# Patient Record
Sex: Female | Born: 1950 | Race: White | Hispanic: No | Marital: Married
Health system: Midwestern US, Community
[De-identification: ages and names within clinical notes are randomized; demographics above are authoritative.]

## PROBLEM LIST (undated history)

## (undated) DIAGNOSIS — K8020 Calculus of gallbladder without cholecystitis without obstruction: Secondary | ICD-10-CM

## (undated) DIAGNOSIS — F32A Depression, unspecified: Secondary | ICD-10-CM

## (undated) DIAGNOSIS — I499 Cardiac arrhythmia, unspecified: Secondary | ICD-10-CM

## (undated) DIAGNOSIS — J189 Pneumonia, unspecified organism: Secondary | ICD-10-CM

## (undated) DIAGNOSIS — F329 Major depressive disorder, single episode, unspecified: Secondary | ICD-10-CM

## (undated) DIAGNOSIS — E785 Hyperlipidemia, unspecified: Secondary | ICD-10-CM

## (undated) DIAGNOSIS — T7840XA Allergy, unspecified, initial encounter: Secondary | ICD-10-CM

## (undated) DIAGNOSIS — R519 Headache, unspecified: Secondary | ICD-10-CM

## (undated) DIAGNOSIS — M1990 Unspecified osteoarthritis, unspecified site: Secondary | ICD-10-CM

## (undated) DIAGNOSIS — D126 Benign neoplasm of colon, unspecified: Secondary | ICD-10-CM

## (undated) DIAGNOSIS — T8859XA Other complications of anesthesia, initial encounter: Secondary | ICD-10-CM

## (undated) HISTORY — PX: KNEE ARTHROSCOPY: SUR90

## (undated) HISTORY — DX: Major depressive disorder, single episode, unspecified: F32.9

## (undated) HISTORY — DX: Calculus of gallbladder without cholecystitis without obstruction: K80.20

## (undated) HISTORY — DX: Unspecified osteoarthritis, unspecified site: M19.90

## (undated) HISTORY — PX: APPENDECTOMY: SHX54

## (undated) HISTORY — DX: Depression, unspecified: F32.A

## (undated) HISTORY — DX: Allergy, unspecified, initial encounter: T78.40XA

## (undated) HISTORY — PX: COLONOSCOPY: SHX174

## (undated) HISTORY — DX: Hyperlipidemia, unspecified: E78.5

## (undated) HISTORY — PX: OTHER SURGICAL HISTORY: SHX169

## (undated) HISTORY — DX: Benign neoplasm of colon, unspecified: D12.6

## (undated) HISTORY — PX: BREAST ENHANCEMENT SURGERY: SHX7

## (undated) HISTORY — PX: ABDOMINAL HYSTERECTOMY: SHX81

## (undated) HISTORY — PX: THUMB ARTHROSCOPY: SHX2509

## (undated) HISTORY — PX: TONSILLECTOMY: SUR1361

---

## 1988-09-07 HISTORY — PX: CHOLECYSTECTOMY: SHX55

## 2002-06-15 ENCOUNTER — Other Ambulatory Visit: Admission: RE | Admit: 2002-06-15 | Discharge: 2002-06-15 | Payer: Self-pay | Admitting: Family Medicine

## 2003-10-10 ENCOUNTER — Other Ambulatory Visit: Admission: RE | Admit: 2003-10-10 | Discharge: 2003-10-10 | Payer: Self-pay | Admitting: Internal Medicine

## 2003-12-03 LAB — HM COLONOSCOPY: HM Colonoscopy: NORMAL

## 2004-04-13 ENCOUNTER — Emergency Department (HOSPITAL_COMMUNITY): Admission: EM | Admit: 2004-04-13 | Discharge: 2004-04-13 | Payer: Self-pay | Admitting: Emergency Medicine

## 2004-07-09 ENCOUNTER — Ambulatory Visit: Payer: Self-pay | Admitting: Family Medicine

## 2004-08-20 ENCOUNTER — Ambulatory Visit: Payer: Self-pay | Admitting: Internal Medicine

## 2004-12-02 ENCOUNTER — Ambulatory Visit: Payer: Self-pay | Admitting: Family Medicine

## 2004-12-02 ENCOUNTER — Encounter (INDEPENDENT_AMBULATORY_CARE_PROVIDER_SITE_OTHER): Payer: Self-pay | Admitting: Internal Medicine

## 2004-12-02 ENCOUNTER — Other Ambulatory Visit: Admission: RE | Admit: 2004-12-02 | Discharge: 2004-12-02 | Payer: Self-pay | Admitting: Family Medicine

## 2004-12-02 LAB — CONVERTED CEMR LAB: Pap Smear: NORMAL

## 2004-12-12 ENCOUNTER — Ambulatory Visit: Payer: Self-pay | Admitting: Family Medicine

## 2004-12-15 ENCOUNTER — Ambulatory Visit: Payer: Self-pay | Admitting: Family Medicine

## 2005-01-08 ENCOUNTER — Ambulatory Visit: Payer: Self-pay | Admitting: Family Medicine

## 2005-01-14 ENCOUNTER — Encounter (INDEPENDENT_AMBULATORY_CARE_PROVIDER_SITE_OTHER): Payer: Self-pay | Admitting: Specialist

## 2005-01-14 ENCOUNTER — Observation Stay (HOSPITAL_COMMUNITY): Admission: EM | Admit: 2005-01-14 | Discharge: 2005-01-15 | Payer: Self-pay | Admitting: Emergency Medicine

## 2005-04-02 ENCOUNTER — Ambulatory Visit: Payer: Self-pay | Admitting: Family Medicine

## 2005-05-04 ENCOUNTER — Ambulatory Visit: Payer: Self-pay | Admitting: Family Medicine

## 2005-05-15 ENCOUNTER — Ambulatory Visit: Payer: Self-pay | Admitting: Family Medicine

## 2005-06-08 ENCOUNTER — Ambulatory Visit: Payer: Self-pay | Admitting: Family Medicine

## 2005-07-01 ENCOUNTER — Ambulatory Visit: Payer: Self-pay | Admitting: Family Medicine

## 2005-07-24 ENCOUNTER — Ambulatory Visit: Payer: Self-pay | Admitting: Family Medicine

## 2005-09-14 ENCOUNTER — Ambulatory Visit: Payer: Self-pay | Admitting: Family Medicine

## 2005-10-15 ENCOUNTER — Ambulatory Visit: Payer: Self-pay | Admitting: Family Medicine

## 2006-01-06 ENCOUNTER — Ambulatory Visit: Payer: Self-pay | Admitting: Family Medicine

## 2006-02-11 ENCOUNTER — Ambulatory Visit: Payer: Self-pay | Admitting: Family Medicine

## 2006-03-26 ENCOUNTER — Ambulatory Visit: Payer: Self-pay | Admitting: Family Medicine

## 2006-03-26 LAB — FECAL OCCULT BLOOD, GUAIAC: Fecal Occult Blood: NEGATIVE

## 2006-06-07 ENCOUNTER — Ambulatory Visit: Payer: Self-pay | Admitting: Family Medicine

## 2006-08-03 ENCOUNTER — Ambulatory Visit: Payer: Self-pay | Admitting: Family Medicine

## 2006-09-09 ENCOUNTER — Ambulatory Visit: Payer: Self-pay | Admitting: Family Medicine

## 2006-11-01 ENCOUNTER — Ambulatory Visit: Payer: Self-pay | Admitting: Specialist

## 2006-11-25 ENCOUNTER — Ambulatory Visit: Payer: Self-pay | Admitting: Family Medicine

## 2006-12-03 ENCOUNTER — Ambulatory Visit: Payer: Self-pay | Admitting: Family Medicine

## 2007-04-27 ENCOUNTER — Ambulatory Visit: Payer: Self-pay | Admitting: Family Medicine

## 2007-04-27 DIAGNOSIS — M545 Low back pain, unspecified: Secondary | ICD-10-CM | POA: Insufficient documentation

## 2007-06-14 ENCOUNTER — Ambulatory Visit: Payer: Self-pay | Admitting: Family Medicine

## 2007-06-15 LAB — CONVERTED CEMR LAB
CO2: 31 meq/L (ref 19–32)
Cholesterol: 230 mg/dL (ref 0–200)
Creatinine, Ser: 0.8 mg/dL (ref 0.4–1.2)
Direct LDL: 149.4 mg/dL
Potassium: 4.8 meq/L (ref 3.5–5.1)
Sodium: 141 meq/L (ref 135–145)
VLDL: 30 mg/dL (ref 0–40)

## 2007-06-27 ENCOUNTER — Encounter (INDEPENDENT_AMBULATORY_CARE_PROVIDER_SITE_OTHER): Payer: Self-pay | Admitting: Internal Medicine

## 2007-06-28 ENCOUNTER — Ambulatory Visit: Payer: Self-pay | Admitting: Family Medicine

## 2007-06-29 ENCOUNTER — Encounter (INDEPENDENT_AMBULATORY_CARE_PROVIDER_SITE_OTHER): Payer: Self-pay | Admitting: *Deleted

## 2007-07-08 ENCOUNTER — Encounter (INDEPENDENT_AMBULATORY_CARE_PROVIDER_SITE_OTHER): Payer: Self-pay | Admitting: Internal Medicine

## 2007-07-13 ENCOUNTER — Encounter (INDEPENDENT_AMBULATORY_CARE_PROVIDER_SITE_OTHER): Payer: Self-pay | Admitting: Internal Medicine

## 2007-07-26 ENCOUNTER — Telehealth (INDEPENDENT_AMBULATORY_CARE_PROVIDER_SITE_OTHER): Payer: Self-pay | Admitting: Internal Medicine

## 2007-10-18 ENCOUNTER — Telehealth (INDEPENDENT_AMBULATORY_CARE_PROVIDER_SITE_OTHER): Payer: Self-pay | Admitting: Internal Medicine

## 2007-11-01 ENCOUNTER — Telehealth (INDEPENDENT_AMBULATORY_CARE_PROVIDER_SITE_OTHER): Payer: Self-pay | Admitting: Internal Medicine

## 2008-02-02 ENCOUNTER — Ambulatory Visit: Payer: Self-pay | Admitting: Family Medicine

## 2008-02-07 ENCOUNTER — Telehealth (INDEPENDENT_AMBULATORY_CARE_PROVIDER_SITE_OTHER): Payer: Self-pay | Admitting: Internal Medicine

## 2008-02-16 ENCOUNTER — Telehealth (INDEPENDENT_AMBULATORY_CARE_PROVIDER_SITE_OTHER): Payer: Self-pay | Admitting: Internal Medicine

## 2008-02-23 ENCOUNTER — Encounter (INDEPENDENT_AMBULATORY_CARE_PROVIDER_SITE_OTHER): Payer: Self-pay | Admitting: Internal Medicine

## 2008-06-04 ENCOUNTER — Ambulatory Visit: Payer: Self-pay | Admitting: Family Medicine

## 2008-06-04 LAB — CONVERTED CEMR LAB
Bilirubin Urine: NEGATIVE
Nitrite: NEGATIVE
Specific Gravity, Urine: 1.01
Urobilinogen, UA: 0.2
pH: 7.5

## 2008-06-05 ENCOUNTER — Encounter: Payer: Self-pay | Admitting: Family Medicine

## 2008-06-20 ENCOUNTER — Ambulatory Visit: Payer: Self-pay | Admitting: Family Medicine

## 2008-06-27 ENCOUNTER — Ambulatory Visit: Payer: Self-pay | Admitting: Family Medicine

## 2008-06-28 LAB — CONVERTED CEMR LAB
Bilirubin, Direct: 0.1 mg/dL (ref 0.0–0.3)
Calcium: 8.9 mg/dL (ref 8.4–10.5)
Cholesterol: 236 mg/dL (ref 0–200)
Creatinine, Ser: 0.9 mg/dL (ref 0.4–1.2)
GFR calc Af Amer: 83 mL/min
GFR calc non Af Amer: 69 mL/min
HDL: 54.1 mg/dL (ref >39.0)
Total Bilirubin: 0.9 mg/dL (ref 0.3–1.2)
Triglycerides: 107 mg/dL (ref 0–149)

## 2008-07-09 ENCOUNTER — Telehealth (INDEPENDENT_AMBULATORY_CARE_PROVIDER_SITE_OTHER): Payer: Self-pay | Admitting: Internal Medicine

## 2008-07-16 ENCOUNTER — Telehealth: Payer: Self-pay | Admitting: Family Medicine

## 2008-08-08 ENCOUNTER — Ambulatory Visit: Payer: Self-pay | Admitting: Family Medicine

## 2008-08-21 ENCOUNTER — Ambulatory Visit: Payer: Self-pay | Admitting: Family Medicine

## 2008-08-22 ENCOUNTER — Ambulatory Visit: Payer: Self-pay | Admitting: Family Medicine

## 2008-08-22 LAB — CONVERTED CEMR LAB
BUN: 18 mg/dL (ref 6–23)
Calcium: 9.2 mg/dL (ref 8.4–10.5)
Chloride: 106 meq/L (ref 96–112)
Eosinophils Absolute: 0.1 10*3/uL (ref 0.0–0.7)
GFR calc non Af Amer: 92 mL/min
Hemoglobin: 15 g/dL (ref 12.0–15.0)
Lipase: 26 units/L (ref 11.0–59.0)
MCV: 91.8 fL (ref 78.0–100.0)
Monocytes Absolute: 0.5 10*3/uL (ref 0.1–1.0)
OCCULT 1: NEGATIVE
Platelets: 182 10*3/uL (ref 150–400)
Potassium: 4.5 meq/L (ref 3.5–5.1)
RBC: 4.59 M/uL (ref 3.87–5.11)
RDW: 11.4 % — ABNORMAL LOW (ref 11.5–14.6)
Sodium: 140 meq/L (ref 135–145)

## 2008-08-23 ENCOUNTER — Encounter (INDEPENDENT_AMBULATORY_CARE_PROVIDER_SITE_OTHER): Payer: Self-pay | Admitting: *Deleted

## 2008-11-14 ENCOUNTER — Encounter (INDEPENDENT_AMBULATORY_CARE_PROVIDER_SITE_OTHER): Payer: Self-pay | Admitting: Internal Medicine

## 2008-11-20 ENCOUNTER — Encounter (INDEPENDENT_AMBULATORY_CARE_PROVIDER_SITE_OTHER): Payer: Self-pay | Admitting: Internal Medicine

## 2008-11-20 ENCOUNTER — Encounter (INDEPENDENT_AMBULATORY_CARE_PROVIDER_SITE_OTHER): Payer: Self-pay | Admitting: *Deleted

## 2008-12-04 ENCOUNTER — Telehealth (INDEPENDENT_AMBULATORY_CARE_PROVIDER_SITE_OTHER): Payer: Self-pay | Admitting: Internal Medicine

## 2008-12-19 ENCOUNTER — Encounter (INDEPENDENT_AMBULATORY_CARE_PROVIDER_SITE_OTHER): Payer: Self-pay | Admitting: Internal Medicine

## 2008-12-26 ENCOUNTER — Ambulatory Visit: Payer: Self-pay | Admitting: Psychiatry

## 2009-05-20 ENCOUNTER — Ambulatory Visit: Payer: Self-pay | Admitting: Internal Medicine

## 2009-07-09 ENCOUNTER — Ambulatory Visit: Payer: Self-pay | Admitting: Family Medicine

## 2009-07-09 ENCOUNTER — Other Ambulatory Visit: Admission: RE | Admit: 2009-07-09 | Discharge: 2009-07-09 | Payer: Self-pay | Admitting: Family Medicine

## 2009-07-09 ENCOUNTER — Encounter (INDEPENDENT_AMBULATORY_CARE_PROVIDER_SITE_OTHER): Payer: Self-pay | Admitting: Internal Medicine

## 2009-07-15 ENCOUNTER — Encounter (INDEPENDENT_AMBULATORY_CARE_PROVIDER_SITE_OTHER): Payer: Self-pay | Admitting: Internal Medicine

## 2009-08-12 ENCOUNTER — Encounter (INDEPENDENT_AMBULATORY_CARE_PROVIDER_SITE_OTHER): Payer: Self-pay | Admitting: Internal Medicine

## 2009-08-12 DIAGNOSIS — Z87891 Personal history of nicotine dependence: Secondary | ICD-10-CM | POA: Insufficient documentation

## 2009-08-12 DIAGNOSIS — I498 Other specified cardiac arrhythmias: Secondary | ICD-10-CM | POA: Insufficient documentation

## 2009-08-26 ENCOUNTER — Ambulatory Visit: Payer: Self-pay | Admitting: Family Medicine

## 2009-08-28 LAB — CONVERTED CEMR LAB
BUN: 13 mg/dL (ref 6–23)
Basophils Absolute: 0.1 10*3/uL (ref 0.0–0.1)
Basophils Relative: 1 % (ref 0.0–3.0)
Bilirubin, Direct: 0 mg/dL (ref 0.0–0.3)
Chloride: 106 meq/L (ref 96–112)
Direct LDL: 156 mg/dL
Eosinophils Absolute: 0.2 10*3/uL (ref 0.0–0.7)
GFR calc non Af Amer: 91.47 mL/min (ref >60)
HDL: 55.3 mg/dL (ref >39.00)
Lymphs Abs: 2 10*3/uL (ref 0.7–4.0)
MCV: 93.8 fL (ref 78.0–100.0)
Monocytes Absolute: 0.5 10*3/uL (ref 0.1–1.0)
Neutro Abs: 2.8 10*3/uL (ref 1.4–7.7)
Potassium: 4 meq/L (ref 3.5–5.1)
RBC: 4.87 M/uL (ref 3.87–5.11)
RDW: 12.1 % (ref 11.5–14.6)
Total Bilirubin: 0.9 mg/dL (ref 0.3–1.2)
Total CHOL/HDL Ratio: 4
Total Protein: 6.8 g/dL (ref 6.0–8.3)
Triglycerides: 106 mg/dL (ref 0.0–149.0)
VLDL: 21.2 mg/dL (ref 0.0–40.0)
WBC: 5.6 10*3/uL (ref 4.5–10.5)

## 2009-11-06 ENCOUNTER — Ambulatory Visit: Payer: Self-pay | Admitting: Internal Medicine

## 2009-11-06 DIAGNOSIS — M1990 Unspecified osteoarthritis, unspecified site: Secondary | ICD-10-CM | POA: Insufficient documentation

## 2009-11-06 DIAGNOSIS — E785 Hyperlipidemia, unspecified: Secondary | ICD-10-CM | POA: Insufficient documentation

## 2009-11-18 ENCOUNTER — Encounter: Payer: Self-pay | Admitting: Internal Medicine

## 2009-11-20 ENCOUNTER — Encounter: Payer: Self-pay | Admitting: Internal Medicine

## 2009-12-02 ENCOUNTER — Encounter: Payer: Self-pay | Admitting: Internal Medicine

## 2010-01-07 ENCOUNTER — Ambulatory Visit: Payer: Self-pay | Admitting: Internal Medicine

## 2010-01-07 DIAGNOSIS — R109 Unspecified abdominal pain: Secondary | ICD-10-CM | POA: Insufficient documentation

## 2010-01-07 LAB — CONVERTED CEMR LAB
Bilirubin Urine: NEGATIVE
Specific Gravity, Urine: 1.015
WBC Urine, dipstick: NEGATIVE
pH: 6

## 2010-01-30 ENCOUNTER — Ambulatory Visit: Payer: Self-pay | Admitting: Specialist

## 2010-02-04 ENCOUNTER — Ambulatory Visit: Payer: Self-pay | Admitting: Specialist

## 2010-07-14 ENCOUNTER — Encounter: Payer: Self-pay | Admitting: Internal Medicine

## 2010-07-14 ENCOUNTER — Ambulatory Visit: Payer: Self-pay | Admitting: Internal Medicine

## 2010-07-14 DIAGNOSIS — R002 Palpitations: Secondary | ICD-10-CM | POA: Insufficient documentation

## 2010-07-15 LAB — CONVERTED CEMR LAB
ALT: 22 units/L (ref 0–35)
AST: 22 units/L (ref 0–37)
Basophils Absolute: 0 10*3/uL (ref 0.0–0.1)
Basophils Relative: 0.7 % (ref 0.0–3.0)
Bilirubin, Direct: 0.1 mg/dL (ref 0.0–0.3)
Cholesterol: 233 mg/dL — ABNORMAL HIGH (ref 0–200)
Direct LDL: 160.2 mg/dL
Eosinophils Absolute: 0.1 10*3/uL (ref 0.0–0.7)
Eosinophils Relative: 2 % (ref 0.0–5.0)
GFR calc non Af Amer: 104.89 mL/min (ref >60)
Glucose, Bld: 89 mg/dL (ref 70–99)
Hemoglobin: 15.2 g/dL — ABNORMAL HIGH (ref 12.0–15.0)
Lymphs Abs: 2.5 10*3/uL (ref 0.7–4.0)
Neutro Abs: 3.1 10*3/uL (ref 1.4–7.7)
Phosphorus: 4.2 mg/dL (ref 2.3–4.6)
Platelets: 200 10*3/uL (ref 150.0–400.0)
Potassium: 4.5 meq/L (ref 3.5–5.1)
RBC: 4.63 M/uL (ref 3.87–5.11)
Sodium: 143 meq/L (ref 135–145)
TSH: 1.43 microintl units/mL (ref 0.35–5.50)
Triglycerides: 118 mg/dL (ref 0.0–149.0)
VLDL: 23.6 mg/dL (ref 0.0–40.0)

## 2010-07-22 ENCOUNTER — Ambulatory Visit: Payer: Self-pay | Admitting: Internal Medicine

## 2010-07-24 LAB — CONVERTED CEMR LAB: Fecal Occult Bld: NEGATIVE

## 2010-10-07 NOTE — Assessment & Plan Note (Signed)
Summary: cpx/dlo   Vital Signs:  Patient profile:   60 year old female Height:      67 inches Weight:      143 pounds BMI:     22.48 Temp:     98.1 degrees F oral Pulse rate:   72 / minute Pulse rhythm:   regular BP sitting:   140 / 82  (left arm) Cuff size:   regular  Vitals Entered By: Linde Gillis CMA Duncan Dull) (July 14, 2010 11:50 AM) CC: complete physicial   History of Present Illness: Had problems with right thumb---operated on in May  Discussed colon cancer screening she prefers not to have colonscopy--has never had before Had mammo  Occ has middle of the night awakening takes a while to get back to sleep no caffeine ater breakfast No sig mood issues Initiates sleep  ~10-11PM, then awakens  ~3-4 in AM She may go back to sleep quickly, or sometimes takes an hour Awakens  ~7:30AM. Mostly feels refreshed  Has arthritis problems Hips, left knee, back variable --not necessarily due to activity though vacuuming does tend to make her hip hurt Takes advil up to 4 two times a day in past--now typically takes 3 at night tylenol not helpful  Allergies: 1)  ! Meloxicam (Meloxicam) 2)  Xylocaine 3)  * Decongestants  Past History:  Past medical, surgical, family and social histories (including risk factors) reviewed for relevance to current acute and chronic problems.  Past Medical History: Reviewed history from 11/06/2009 and no changes required. Hyperlipidemia Osteoarthritis  Past Surgical History: Hysterectomy- still has ovaries Cholecystectomy Tubal ligation Appendectomy CT of abd/pelvis--neg--6/05 DEXA--3/05--neg 5/11  Right thumb surgery  Family History: Reviewed history from 06/14/2007 and no changes required. Father: died at 86--CHF, prostate ca, MI x2, DM, HBP, elevated lipids, dementia Mother: died at 59-- cerebral hemmorhage, HBP, CVA, HBP, osteoarthritis Siblings: 1 br--L&W               1half sister--died 14--MVA DM-MGM--med induced MI-  PGF, Muncle CVA- Muncle Prostate Cancer- Breast Cancer-0 Ovarian Cancer-0 Uterine Cancer-0 Colon Cancer- 0 Drug/ ETOH Abuse- Depression-   PGM--dementia and Muncle Muncle--Hodgkins  Social History: Reviewed history from 11/06/2009 and no changes required. Marital Status: Married Children: 4 and 2 step sons and 1 step daughter Occupation: at home Never Smoked Alcohol use-rare  Review of Systems General:  stays active but no set exercise regimen weight is stable had night awakening wears seat belt. Eyes:  Denies double vision and vision loss-1 eye. ENT:  Complains of ringing in ears; denies decreased hearing; occ mild tinnitus teeth okay--regular with dentist. CV:  Complains of chest pain or discomfort and palpitations; denies difficulty breathing at night, difficulty breathing while lying down, fainting, lightheadness, and shortness of breath with exertion; gets occ "pinch" in upper chest gets palpitations at times--- heart races occ. Resp:  Complains of cough; denies shortness of breath; cough due to allergy drainage. GI:  Complains of abdominal pain and indigestion; denies bloody stools, change in bowel habits, constipation, dark tarry stools, nausea, and vomiting; occ indigestion Will occ gets intense left flank pain---about twice a month. Hours, then resolves. GU:  Complains of incontinence; denies dysuria and urinary hesitancy; occ very mild stress incontinence Does have some urgency No sexual problems. MS:  Complains of joint pain and low back pain; denies joint swelling. Derm:  Complains of dryness; denies lesion(s) and rash. Neuro:  Denies headaches, numbness, tingling, and weakness. Psych:  Denies anxiety and depression. Heme:  Denies abnormal bruising and enlarge lymph nodes. Allergy:  Complains of seasonal allergies and sneezing; uses zyrtec.  Physical Exam  General:  alert and normal appearance.   Eyes:  pupils equal, pupils round, pupils reactive to light,  and no optic disk abnormalities.   Ears:  R ear normal and L ear normal.   Mouth:  no erythema, no exudates, and no lesions.   Neck:  supple, no masses, no thyromegaly, no carotid bruits, and no cervical lymphadenopathy.   Breasts:  no abnormal thickening, no tenderness, and no adenopathy.  Mild cystic changes bilaterally Lungs:  normal respiratory effort, no intercostal retractions, no accessory muscle use, and normal breath sounds.   Heart:  normal rate, regular rhythm, no murmur, and no gallop.   Abdomen:  soft, non-tender, and no masses.   Msk:  no joint tenderness and no joint swelling.   Pulses:  1+ in feet Extremities:  no edema Neurologic:  alert & oriented X3, strength normal in all extremities, and gait normal.   Skin:  no rashes and no suspicious lesions.   Psych:  normally interactive, good eye contact, not anxious appearing, and not depressed appearing.     Impression & Recommendations:  Problem # 1:  PREVENTIVE HEALTH CARE (ICD-V70.0) Assessment Comment Only had mammo wants zostavax--will wait till 54 for insurance doesn't want colon--will do immunoassay discussed fitness  Problem # 2:  PALPITATIONS (ICD-785.1) Assessment: New  sounds benign will just check labs  Orders: TLB-Renal Function Panel (80069-RENAL) TLB-CBC Platelet - w/Differential (85025-CBCD) TLB-Hepatic/Liver Function Pnl (80076-HEPATIC) TLB-TSH (Thyroid Stimulating Hormone) (84443-TSH) Venipuncture (95621) EKG w/ Interpretation (93000)  Problem # 3:  OSTEOARTHRITIS (ICD-715.90) Assessment: Unchanged okay with ibuprofen  Problem # 4:  HYPERLIPIDEMIA (ICD-272.4) Assessment: Comment Only  discussed primary prevention  no labs  Orders: TLB-Lipid Panel (80061-LIPID)  Complete Medication List: 1)  Centrum Silver Tabs (Multiple vitamins-minerals) .... Take 1 by mouth once daily 2)  Caltrate 600 1500 Mg Tabs (Calcium carbonate) .... Take 1 by mouth once daily 3)  Aspirin 81 Mg Tabs (Aspirin)  .... Take 1 by mouth once daily  Patient Instructions: 1)  Please schedule a follow-up appointment in 1 year.  2)  Complete your hemoccult cards and return them soon.    Orders Added: 1)  Est. Patient 40-64 years [99396] 2)  TLB-Renal Function Panel [80069-RENAL] 3)  TLB-CBC Platelet - w/Differential [85025-CBCD] 4)  TLB-Hepatic/Liver Function Pnl [80076-HEPATIC] 5)  TLB-TSH (Thyroid Stimulating Hormone) [84443-TSH] 6)  Venipuncture [36415] 7)  TLB-Lipid Panel [80061-LIPID] 8)  EKG w/ Interpretation [93000]    Current Allergies (reviewed today): ! MELOXICAM (MELOXICAM) XYLOCAINE * DECONGESTANTS

## 2010-10-07 NOTE — Assessment & Plan Note (Signed)
Summary: ABD PAIN/ 8:15 PER DR Antinette Keough   Vital Signs:  Patient profile:   60 year old Mcclain Weight:      143 pounds Temp:     98.1 degrees F oral Pulse rate:   68 / minute Pulse rhythm:   regular BP sitting:   110 / 62  (left arm) Cuff size:   regular  Vitals Entered By: Mervin Hack CMA [facility]) (Jan 07, 2010 8:15 AM) CC: abdominal pain   History of Present Illness: Having left flank pain that radiates around to side Has gone on for 4-5 days Sharp character and may persist for several hours at lower level has had this before but never for so long  Yesterday, had pain in axilla, neck and down arm hasn't recurred  No sig exercise Does do house cleaning--like vacuuming avoids heavy lifting due to hand arthritis  No dysuria No urgency More AM frequency No fever  No vomiting occ queasy with the pain appetite is okay  No SOB no cough  no pleuritic component  Allergies: 1)  ! Meloxicam (Meloxicam) 2)  Xylocaine 3)  * Decongestants  Past History:  Past medical, surgical, family and social histories (including risk factors) reviewed for relevance to current acute and chronic problems.  Past Medical History: Reviewed history from 11/06/2009 and no changes required. Hyperlipidemia Osteoarthritis  Past Surgical History: Reviewed history from 08/12/2009 and no changes required. Hysterectomy- still has ovaries Cholecystectomy Tubal ligation Appendectomy CT of abd/pelvis--neg--6/05 DEXA--3/05--neg  Family History: Reviewed history from 06/14/2007 and no changes required. Father: died at 86--CHF, prostate ca, MI x2, DM, HBP, elevated lipids, dementia Mother: died at 90-- cerebral hemmorhage, HBP, CVA, HBP, osteoarthritis Siblings: 1 br--L&W               1half sister--died 14--MVA DM-MGM--med induced MI- PGF, Muncle CVA- Muncle Prostate Cancer- Breast Cancer-0 Ovarian Cancer-0 Uterine Cancer-0 Colon Cancer- 0 Drug/ ETOH Abuse- Depression-    PGM--dementia and Muncle Muncle--Hodgkins  Social History: Reviewed history from 11/06/2009 and no changes required. Marital Status: Married Children: 4 and 2 step sons and 1 step daughter Occupation: at home Never Smoked Alcohol use-rare  Review of Systems       left paraspinal back pain in past  saw orthiopedist and diagnoses with arthtritis---better with muscle relaxer  Physical Exam  General:  alert and normal appearance.   Neck:  supple, no masses, and no cervical lymphadenopathy.   Lungs:  normal respiratory effort and normal breath sounds.   Heart:  normal rate, regular rhythm, no murmur, and no gallop.   Abdomen:  soft, non-tender, normal bowel sounds, and no masses.   Msk:  no sig spine or flank tenderness Pain not brought on by arm movement Extremities:  no edema Psych:  normally interactive, good eye contact, not anxious appearing, and not depressed appearing.     Impression & Recommendations:  Problem # 1:  FLANK PAIN, LEFT (ICD-789.09) Assessment New  Seems likely to be superficial (musculoskeletal pain) nothing to suggest kidney stone or infection no pulmonary symptoms  will try OTC analgesics recheck if worsens  Her updated medication list for this problem includes:    Aspirin 81 Mg Tabs (Aspirin) .Marland Kitchen... Take 1 by mouth once daily    Methocarbamol 500 Mg Tabs (Methocarbamol) .Marland Kitchen... Take 1 by mouth two times a day after meals  Orders: UA Dipstick w/o Micro (manual) (40981)  Complete Medication List: 1)  Centrum Silver Tabs (Multiple vitamins-minerals) .... Take 1 by mouth once daily  2)  Caltrate 600 1500 Mg Tabs (Calcium carbonate) .... Take 1 by mouth once daily 3)  Aspirin 81 Mg Tabs (Aspirin) .... Take 1 by mouth once daily 4)  Methocarbamol 500 Mg Tabs (Methocarbamol) .... Take 1 by mouth two times a day after meals  Patient Instructions: 1)  Please schedule a follow-up appointment as needed .   Current Allergies (reviewed today): !  MELOXICAM (MELOXICAM) XYLOCAINE * DECONGESTANTS  Laboratory Results   Urine Tests  Date/Time Received: Jan 07, 2010 8:53 AM Date/Time Reported: Jan 07, 2010 8:53 AM  Routine Urinalysis   Color: yellow Appearance: Hazy Glucose: negative   (Normal Range: Negative) Bilirubin: negative   (Normal Range: Negative) Ketone: negative   (Normal Range: Negative) Spec. Gravity: 1.015   (Normal Range: 1.003-1.035) Blood: negative   (Normal Range: Negative) pH: 6.0   (Normal Range: 5.0-8.0) Protein: negative   (Normal Range: Negative) Urobilinogen: 0.2   (Normal Range: 0-1) Nitrite: negative   (Normal Range: Negative) Leukocyte Esterace: negative   (Normal Range: Negative)

## 2010-10-07 NOTE — Letter (Signed)
Summary: Results Follow up Letter  [facility]  [address] [place], [place] [address]   Phone: [phone]  Fax: [phone]    11/20/2009 MRN: [id number]  [name] [address] [place], [place]  [address]  Dear [name],  The following are the results of your recent test(s):  Test         Result    Pap Smear:        Normal _____  Not Normal _____ Comments: ______________________________________________________ Cholesterol: LDL(Bad cholesterol):         Your goal is less than:         HDL (Good cholesterol):       Your goal is more than: Comments:  ______________________________________________________ Mammogram:        Normal __X___  Not Normal _____ Comments:repeat recommended in 1-2 years  ___________________________________________________________________ Hemoccult:        Normal _____  Not normal _______ Comments:    _____________________________________________________________________ Other Tests:    We routinely do not discuss normal results over the telephone.  If you desire a copy of the results, or you have any questions about this information we can discuss them at your next office visit.   Sincerely,      Tillman Abide, MD

## 2010-10-07 NOTE — Letter (Signed)
Summary: [facility]  [facility]   Imported By: Lanelle Bal 12/12/2009 14:11:08  _____________________________________________________________________  External Attachment:    Type:   Image     Comment:   External Document  Appended Document: [facility] scheduling screening colonoscopy

## 2010-10-07 NOTE — Assessment & Plan Note (Signed)
Summary: ROA XFER FROM [facility]-WANT TO HAVE   Vital Signs:  Patient profile:   60 year old female Weight:      140 pounds Temp:     98.4 degrees F oral BP sitting:   110 / 70  (left arm) Cuff size:   regular  Vitals Entered By: Mervin Hack CMA [facility] Dull) (November 06, 2009 11:38 AM) CC: referral for colonoscopy   History of Present Illness: Has thought about the colonoscopy She is ready to schedule  Now walking regularly 2 miles per day generally  chronic arthritic  pain Has left CMC replaced and may need the right one  Preventive Screening-Counseling & Management  Alcohol-Tobacco     Smoking Status: never  Allergies: 1)  ! Meloxicam (Meloxicam) 2)  Xylocaine 3)  * Decongestants  Past History:  Past medical, surgical, family and social histories (including risk factors) reviewed for relevance to current acute and chronic problems.  Past Medical History: Hyperlipidemia Osteoarthritis  Past Surgical History: Reviewed history from 08/12/2009 and no changes required. Hysterectomy- still has ovaries Cholecystectomy Tubal ligation Appendectomy CT of abd/pelvis--neg--6/05 DEXA--3/05--neg  Family History: Reviewed history from 06/14/2007 and no changes required. Father: died at 86--CHF, prostate ca, MI x2, DM, HBP, elevated lipids, dementia Mother: died at 54-- cerebral hemmorhage, HBP, CVA, HBP, osteoarthritis Siblings: 1 br--L&W               1half sister--died 14--MVA DM-MGM--med induced MI- PGF, Muncle CVA- Muncle Prostate Cancer- Breast Cancer-0 Ovarian Cancer-0 Uterine Cancer-0 Colon Cancer- 0 Drug/ ETOH Abuse- Depression-   PGM--dementia and Muncle Muncle--Hodgkins  Social History: Marital Status: Married Children: 4 and 2 step sons and 1 step daughter Occupation: at home Never Smoked Alcohol use-rare Smoking Status:  never   Impression & Recommendations:  Problem # 1:  MALIGNANT NEOPLASM RECTUM; COLON CANCER SCREENING  (ICD-V76.51) Assessment Comment Only  will set up colonoscopy  Orders: No Charge Patient Arrived (NCPA0) (NCPA0) Gastroenterology Referral (GI)  Complete Medication List: 1)  Centrum Silver Tabs (Multiple vitamins-minerals) .... Take 1 by mouth once daily 2)  Caltrate 600 1500 Mg Tabs (Calcium carbonate) .... Take 1 by mouth once daily 3)  Aspirin 81 Mg Tabs (Aspirin) .... Take 1 by mouth once daily  Patient Instructions: 1)  Please schedule physical after November 2)  Schedule a colonoscopy/ sigmoidoscopy to help detect colon cancer.   Current Allergies (reviewed today): ! MELOXICAM (MELOXICAM) XYLOCAINE * DECONGESTANTS

## 2010-10-07 NOTE — Letter (Signed)
Summary: [facility]: Immunoassay Fecal Occult Blood (iFOB) Order Form  [facility]  [address] [place], [place] [address]   Phone: [phone]  Fax: [phone]      [facility]: Immunoassay Fecal Occult Blood (iFOB) Order Form   July 14, 2010 MRN: [id number]   ZAHRIA DING Jan 16, 1951   Physicican Name:_________Letvak________________  Diagnosis Code:_________V76.51_________________      Cindee Salt MD

## 2010-11-13 ENCOUNTER — Encounter: Payer: Self-pay | Admitting: Family Medicine

## 2010-11-13 ENCOUNTER — Ambulatory Visit (INDEPENDENT_AMBULATORY_CARE_PROVIDER_SITE_OTHER): Payer: BC Managed Care – PPO | Admitting: Family Medicine

## 2010-11-13 DIAGNOSIS — N3000 Acute cystitis without hematuria: Secondary | ICD-10-CM

## 2010-11-13 DIAGNOSIS — H6120 Impacted cerumen, unspecified ear: Secondary | ICD-10-CM | POA: Insufficient documentation

## 2010-11-13 LAB — CONVERTED CEMR LAB
Ketones, urine, test strip: NEGATIVE
Nitrite: NEGATIVE
Urobilinogen, UA: 0.2
WBC Urine, dipstick: NEGATIVE
pH: 5

## 2010-11-18 NOTE — Assessment & Plan Note (Signed)
Summary: abd pain/cle   bcbs   Vital Signs:  Patient profile:   60 year old female Weight:      142.75 pounds Temp:     98.9 degrees F oral Pulse rate:   96 / minute Pulse rhythm:   regular BP sitting:   112 / 78  (left arm) Cuff size:   regular  Vitals Entered By: Sydell Axon LPN (November 13, 5407 3:03 PM) CC: abd pain, urgency to go to the bathroom and some relief after urinating, right ear feels stopped up   History of Present Illness: Pt here for waking up yesterday with suprapubic pain which builds until urinating. She denioes fever or chills, fatigue, muscle aches or pains, burning or discomfort with urination. BMs are nml and urination is nml, just discomfort building up to the act of urination. Coughing intensifies the discomfort briefly with the cough. She also feels congestion or muffling in the right ear. She is leaving this weekend for a two week trip by car.  Problems Prior to Update: 1)  Preventive Health Care  (ICD-V70.0) 2)  Palpitations  (ICD-785.1) 3)  Flank Pain, Left  (ICD-789.09) 4)  Osteoarthritis  (ICD-715.90) 5)  Hyperlipidemia  (ICD-272.4) 6)  Trigeminy  (ICD-427.89) 7)  Hx of Tobacco Abuse, Hx of  (ICD-V15.82) 8)  Back Pain, Lumbar  (ICD-724.2)  Medications Prior to Update: 1)  Centrum Silver  Tabs (Multiple Vitamins-Minerals) .... Take 1 By Mouth Once Daily 2)  Caltrate 600 1500 Mg Tabs (Calcium Carbonate) .... Take 1 By Mouth Once Daily 3)  Aspirin 81 Mg Tabs (Aspirin) .... Take 1 By Mouth Once Daily  Allergies: 1)  ! Meloxicam (Meloxicam) 2)  Xylocaine 3)  * Decongestants  Physical Exam  General:  Well-developed,well-nourished,in no acute distress; alert,appropriate and cooperative throughout examination Head:  Normocephalic and atraumatic without obvious abnormalities. No apparent alopecia or balding. Eyes:  Conjunctiva clear bilaterally.  Ears:  L ear nml. R ear with thin cerumen totally across the canal and deep enough to preclude easy  removal with currette.  Abdomen:  Positive suprapubic tenderness Msk:  -CVAT.   Impression & Recommendations:  Problem # 1:  ACUTE CYSTITIS (ICD-595.0) Assessment New  U/A neg. Micro neg.  Presumed cystitis.  Short course Cipro for three days. If sxs rerturn while away, retreat for 7 days. Pyridium for 6 days. Lots of fluid. Her updated medication list for this problem includes:    Cipro 250 Mg Tabs (Ciprofloxacin hcl) ..... One tab by mouth two times a day    Pyridium 200 Mg Tabs (Phenazopyridine hcl) ..... One tab by mouth three times a day  Encouraged to push clear liquids, get enough rest, and take acetaminophen as needed. To be seen in 10 days if no improvement, sooner if worse.  Problem # 2:  CERUMEN IMPACTION, RIGHT (ICD-380.4) Assessment: New Discussed irrigation technique for irrigation.  Complete Medication List: 1)  Centrum Silver Tabs (Multiple vitamins-minerals) .... Take 1 by mouth once daily 2)  Caltrate 600 1500 Mg Tabs (Calcium carbonate) .... Take 1 by mouth once daily 3)  Cipro 250 Mg Tabs (Ciprofloxacin hcl) .... One tab by mouth two times a day 4)  Pyridium 200 Mg Tabs (Phenazopyridine hcl) .... One tab by mouth three times a day  Other Orders: UA Dipstick W/ Micro (manual) (81191)  Patient Instructions: 1)  RTC if sxs return. Prescriptions: PYRIDIUM 200 MG TABS (PHENAZOPYRIDINE HCL) one tab by mouth three times a day  #20 x 0  Entered and Authorized by:   Shaune Leeks MD   Signed by:   Shaune Leeks MD on 11/13/2010   Method used:   Electronically to        [facility] [phone]* (retail)       [address] [place], [place]  [address]       Ph: [phone] or [phone]       Fax: [phone]   RxID:   [id number] CIPRO 250 MG TABS (CIPROFLOXACIN HCL) one tab by mouth two times a day  #20 x 0   Entered and Authorized by:   Shaune Leeks MD   Signed by:   Shaune Leeks MD on 11/13/2010    Method used:   Electronically to        [facility] [phone]* (retail)       [address] [place], [place]  [address]       Ph: [phone] or [phone]       Fax: [phone]   RxID:   [id number]    Orders Added: 1)  UA Dipstick W/ Micro (manual) [81000] 2)  Est. Patient Level III [42706]    Current Allergies (reviewed today): ! MELOXICAM (MELOXICAM) XYLOCAINE * DECONGESTANTS  Laboratory Results   Urine Tests  Date/Time Received: November 13, 2010 3:05 PM  Date/Time Reported: November 13, 2010 3:06 PM   Routine Urinalysis   Color: yellow Appearance: Clear Glucose: negative   (Normal Range: Negative) Bilirubin: negative   (Normal Range: Negative) Ketone: negative   (Normal Range: Negative) Spec. Gravity: 1.025   (Normal Range: 1.003-1.035) Blood: small   (Normal Range: Negative) pH: 5.0   (Normal Range: 5.0-8.0) Protein: trace   (Normal Range: Negative) Urobilinogen: 0.2   (Normal Range: 0-1) Nitrite: negative   (Normal Range: Negative) Leukocyte Esterace: negative   (Normal Range: Negative)

## 2010-12-19 ENCOUNTER — Encounter: Payer: Self-pay | Admitting: *Deleted

## 2010-12-23 ENCOUNTER — Encounter: Payer: Self-pay | Admitting: Internal Medicine

## 2011-01-23 NOTE — H&P (Signed)
NAMESAANVI, [name]            ACCOUNT NO.:  [id number]   MEDICAL RECORD NO.:  [id number]          PATIENT TYPE:  INP   LOCATION:  1827                         FACILITY:  [facility]   PHYSICIAN:  [name], M.D.DATE OF BIRTH:  August 25, 1951   DATE OF ADMISSION:  01/14/2005  DATE OF DISCHARGE:                                HISTORY & PHYSICAL   CHIEF COMPLAINT:  Abdominal pain, right side.   HISTORY OF PRESENT ILLNESS:  [name] is a 60 year old female who  presents to [facility] with approximately an 18-hour history  of progressive abdominal pain.  She describes a constant pain initially in  her mid abdomen but now with some radiation to the right mid abdomen.  It  has been gradually worsening and is constant, somewhat worse with movement.  She has had no nausea, vomiting, fever, or chills.  Denies anorexia.  She  did have one episode of diarrhea yesterday.  No bowel movement since.  No  melena or hematochezia.  She has no history of any similar abdominal pain or  chronic GI complaints.   PAST MEDICAL HISTORY:  Surgery is significant for laparoscopic  cholecystectomy, tonsillectomy, vaginal hysterectomy, and augmentation  mammoplasty.   She is followed at [facility] and sees Dr. Eric Form.  She has a  history of rheumatoid arthritis.  She has some mild reflux symptoms for  which she takes AcipHex as her only medication.   She states she is allergic to XYLOCAINE and has had somewhat of a reaction  locally in her IV site to [facility] today.   SOCIAL HISTORY:  She is married.  No cigarettes or alcohol.   FAMILY HISTORY:  Noncontributory.   REVIEW OF SYSTEMS:  GENERAL:  No fever, chills, weight change.  RESPIRATORY:  No shortness of breath, cough, wheezing.  CARDIAC:  No chest pain.  She has  a remote history of trigeminy, but this was  not treated and is no longer an  issue.  GU:  No urinary burning or frequency.   PHYSICAL EXAMINATION:  VITAL SIGNS:  Temperature 98.7, pulse 78, respirations  14, blood pressure 120/70.  GENERAL:  Thin, healthy-appearing female in no acute distress.  SKIN:  Warm and dry without rash or infection.  HEENT:  No mass or thyromegaly.  Sclerae nonicteric.  LUNGS:  Clear to auscultation without wheeze or increased work of breathing.  CARDIAC:  Regular rate and rhythm without murmurs.  No edema.  ABDOMEN:  There is moderate tenderness in the right mid abdomen with slight  guarding.  No peritoneal signs.  No palpable masses or hepatosplenomegaly.  EXTREMITIES:  No joint swelling or deformity.  NEUROLOGIC:  Alert and oriented.  Motor and sensory grossly normal.   LABORATORY DATA AND OTHER STUDIES:  White count elevated at 12,800,  hemoglobin normal.  Urinalysis unremarkable.   CT scan of the abdomen and pelvis was obtained in the emergency room which I  reviewed.  This shows evidence of appendicitis with thickening of the  appendiceal wall and periappendiceal inflammatory change with the appendix  lying high in the right mid abdomen.  ASSESSMENT AND PLAN:  Apparent acute appendicitis.  The patient is being  given broad-spectrum antibiotics IV and will be taken to the operating room  for laparoscopic appendectomy.      BTH/MEDQ  D:  01/14/2005  T:  01/14/2005  Job:  [id number]

## 2011-01-23 NOTE — Op Note (Signed)
NAMEKEYONDA, BICKLE            ACCOUNT NO.:  [id number]   MEDICAL RECORD NO.:  [id number]          PATIENT TYPE:  INP   LOCATION:  1827                         FACILITY:  [facility]   PHYSICIAN:  Sharlet Salina T. Hoxworth, M.D.DATE OF BIRTH:  [birth year]   DATE OF PROCEDURE:  01/14/2005  DATE OF DISCHARGE:                                 OPERATIVE REPORT   PRE- AND POSTOPERATIVE DIAGNOSIS:  Acute appendicitis.   SURGICAL PROCEDURES:  Laparoscopic appendectomy.   SURGEON:  Lorne Skeens. Hoxworth, M.D.   ANESTHESIA:  General.   BRIEF HISTORY:  Ms. Gadomski is a 60 year old female who presents with the  acute onset of right mid abdominal pain. She has had a CT scan of the  abdomen showing evidence of acute appendicitis with the appendix fairly high  in the abdomen on the right side. White count is elevated. I have  recommended proceeding with laparoscopic appendectomy. The nature of the  procedure and indications, risks of bleeding, infection, possible need for  open procedure were discussed and understood. She is now brought to the  operating room for this procedure.   DESCRIPTION OF OPERATION:  The patient was brought to the operating room and  placed in supine position on the operating table and general endotracheal  anesthesia was induced. She received preoperative antibiotics.  The abdomen  was widely sterilely prepped and draped. Access was obtained with a 1 cm  incision at the umbilicus with an open Hasson technique through mattress  suture of 0 Vicryl. Pneumoperitoneum was established. Under direct vision, a  5 mm trocar was placed in the right upper quadrant and a 12 mm trocar in the  left lower quadrant. The cecum was identified and the tinea traced to the  base of the appendix which was lying lateral to the right colon extending up  toward the edge of the liver. The appendix was acutely inflamed throughout  most of its length without evidence of gangrene or perforation. The  appendix  was traced up to its tip and then peritoneal attachments laterally and  posteriorly were divided with the Harmonic scalpel, completely freeing the  appendix down to its base and the mesoappendix. The mesoappendix was divided  at the base of the appendix with the Harmonic scalpel completely freeing the  appendix down to the tip of the cecum. This area was relatively uninflamed.  The appendix was divided at the tip of the cecum with a single firing of the  Endo-GIA 45 mm blue load stapler. There was a small amount oozing from the  staple line controlled with touch of the Harmonic scalpel. Appendix was  brought up into the 12 mm trocar and these were withdrawn. The right lower  quadrant irrigated. Hemostasis assured. Trocars removed. All CO2 evacuated.  The mattress suture was secured at the umbilicus. Skin incisions were closed  with interrupted subcuticular 4-0 Vicryl and Steri-Strips.  Sponge, needle  and instrument counts were correct.  Dry sterile dressing were applied. The  patient taken to recovery in good condition.     BTH/MEDQ  D:  01/14/2005  T:  01/14/2005  Job:  [id number]

## 2011-01-23 NOTE — Discharge Summary (Signed)
NAMEJOYCLYN, PLAZOLA            ACCOUNT NO.:  [id number]   MEDICAL RECORD NO.:  [id number]          PATIENT TYPE:  INP   LOCATION:  5742                         FACILITY:  [facility]   PHYSICIAN:  Sharlet Salina T. Hoxworth, M.D.DATE OF BIRTH:  Oct 07, 1950   DATE OF ADMISSION:  01/14/2005  DATE OF DISCHARGE:  01/15/2005                                 DISCHARGE SUMMARY   DISCHARGE DIAGNOSES:  1.  Acute appendicitis status post laparoscopic appendectomy on Jan 14, 2005      by Dr. Johna Sheriff.  2.  Status post a remote cholecystectomy.  3.  Status post T&A.  4.  Status post hysterectomy.   HOSPITAL COURSE:  Ms. Hollopeter is a 59 year old female who presented 18 hours  after having right mid abdominal pain that worsened.  She did have one  episode of diarrhea, but no nausea, vomiting.  Her white blood cell count on  admission was 12,800.  The CT confirmed acute appendicitis.  She was taken  to the operating room and underwent a laparoscopic appendectomy by Dr.  Johna Sheriff.  She tolerated procedure well and the following day she was  afebrile and ready for discharge to home.   DISCHARGE MEDICATIONS:  1.  Aciphex daily.  2.  Vicodin as needed for pain.   No lifting for one to two weeks greater than 10 pounds.  She may remove the  bandage and shower and keep the Steri-Strips on for approximately one week.  She is to follow up with Dr. Johna Sheriff in 10-14 days and she is to call for  this appointment.      LB/MEDQ  D:  01/15/2005  T:  01/15/2005  Job:  [id number]   cc:   [facility]

## 2011-04-21 ENCOUNTER — Encounter: Payer: Self-pay | Admitting: Internal Medicine

## 2011-04-21 ENCOUNTER — Ambulatory Visit (INDEPENDENT_AMBULATORY_CARE_PROVIDER_SITE_OTHER): Payer: BC Managed Care – PPO | Admitting: Family Medicine

## 2011-04-21 ENCOUNTER — Encounter: Payer: Self-pay | Admitting: Family Medicine

## 2011-04-21 VITALS — BP 130/80 | HR 76 | Temp 97.8°F | Ht 67.0 in | Wt 141.1 lb

## 2011-04-21 DIAGNOSIS — R1012 Left upper quadrant pain: Secondary | ICD-10-CM

## 2011-04-21 LAB — BASIC METABOLIC PANEL
Calcium: 9.3 mg/dL (ref 8.4–10.5)
Chloride: 104 mEq/L (ref 96–112)
GFR: 122.7 mL/min (ref >60.00)
Glucose, Bld: 116 mg/dL — ABNORMAL HIGH (ref 70–99)
Sodium: 141 mEq/L (ref 135–145)

## 2011-04-21 LAB — HEPATIC FUNCTION PANEL
ALT: 18 U/L (ref 0–35)
AST: 22 U/L (ref 0–37)
Alkaline Phosphatase: 77 U/L (ref 39–117)
Bilirubin, Direct: 0 mg/dL (ref 0.0–0.3)
Total Protein: 7 g/dL (ref 6.0–8.3)

## 2011-04-21 LAB — LIPASE: Lipase: 28 U/L (ref 11.0–59.0)

## 2011-04-21 MED ORDER — ESOMEPRAZOLE MAGNESIUM 40 MG PO CPDR: 40.0000 mg | DELAYED_RELEASE_CAPSULE | Freq: Every day | ORAL | Status: DC

## 2011-04-21 NOTE — Patient Instructions (Signed)
Nice to meet you. Please take one tablet daily for at least two weeks. Please call us with an update. Please stop by to see Shirlee Limerick on your way out.

## 2011-04-21 NOTE — Progress Notes (Signed)
Subjective:    Patient ID: [name], female    DOB: Jan 21, 1951, 60 y.o.   MRN: [id number]  HPI  60 yo new to me here left upper quadrant pain.  Symptoms have been on and off for over two years. Was told initially it was gas but symptoms appear to be getting worse.  She has a remote h/o cholecystectomy and appendectomy.  Most recent attack of symptoms was last night.  Sudden onset of left upper quadrant abdominal pain, sharp, that radiates to her left side. Associated with a lot of burping last night. Food seems to make it better. Bending down makes it worse. No nausea, vomiting, or changes in her bowel habits.  No fever.  She is not taking any acid reducers.  Patient Active Problem List  Diagnoses  . HYPERLIPIDEMIA  . TRIGEMINY  . OSTEOARTHRITIS  . BACK PAIN, LUMBAR  . PALPITATIONS  . FLANK PAIN, LEFT  . TOBACCO ABUSE, HX OF  . CERUMEN IMPACTION, RIGHT  . ACUTE CYSTITIS  . Abdominal pain, left upper quadrant   No past medical history on file. No past surgical history on file. History  Substance Use Topics  . Smoking status: Former Games developer  . Smokeless tobacco: Not on file  . Alcohol Use: Not on file   No family history on file. Allergies  Allergen Reactions  . Lidocaine     REACTION: hives  . Meloxicam     REACTION: heart palpitations   No current outpatient prescriptions on file prior to visit.   The PMH, PSH, Social History, Family History, Medications, and allergies have been reviewed in [facility], and have been updated if relevant.   Review of Systems    See HPI Objective:   Physical Exam BP 130/80  Pulse 76  Temp(Src) 97.8 F (36.6 C) (Oral)  Ht 5\' 7"  (1.702 m)  Wt 141 lb 1.9 oz (64.012 kg)  BMI 22.10 kg/m2  SpO2 98%  General:  Well-developed,well-nourished,in no acute distress; alert,appropriate and cooperative throughout examination Head:  normocephalic and atraumatic.   Eyes:  vision grossly intact, pupils equal, pupils round, and  pupils reactive to light.   Nose:  no external deformity.   Mouth:  good dentition.   Lungs:  Normal respiratory effort, chest expands symmetrically. Lungs are clear to auscultation, no crackles or wheezes. Heart:  Normal rate and regular rhythm. S1 and S2 normal without gallop, murmur, click, rub or other extra sounds. Abdomen:  Bowel sounds positive,abdomen soft and non-tender without masses, organomegaly or hernias noted. Msk:  No deformity or scoliosis noted of thoracic or lumbar spine.   Extremities:  No clubbing, cyanosis, edema, or deformity noted with normal full range of motion of all joints.   Neurologic:  alert & oriented X3 and gait normal.   Skin:  Intact without suspicious lesions or rashes Psych:  Cognition and judgment appear intact. Alert and cooperative with normal attention span and concentration. No apparent delusions, illusions, hallucinations        Assessment & Plan:   1. Abdominal pain, left upper quadrant    Deteriorated. Etiology unclear at this point. ?hiatal hernia although location of pain is a little unusual. Will place on Nexium 40 mg daily, samples given. Will also refer to GI for possible endoscopy. Give location and duration of symptoms, I will also check labs today and get CT of abdomen and pelvis. The patient indicates understanding of these issues and agrees with the plan.  Orders Placed This Encounter  Procedures  .  CT Abdomen Pelvis W Wo Contrast    Standing Status: Future     Number of Occurrences:      Standing Expiration Date: 07/21/2012    Order Specific Question:  Reason for exam:    Answer:  left upper quadrant abdominal pain    Order Specific Question:  Is the patient pregnant?    Answer:  No    Order Specific Question:  Preferred imaging location?    Answer:  External  . Basic Metabolic Panel (BMET)  . Hepatic function panel  . Lipase  . Ambulatory referral to Gastroenterology    Referral Priority:  Routine    Referral Type:   Consultation    Referral Reason:  Specialty Services Required    Requested Specialty:  Gastroenterology    Number of Visits Requested:  1

## 2011-04-22 ENCOUNTER — Ambulatory Visit (INDEPENDENT_AMBULATORY_CARE_PROVIDER_SITE_OTHER)
Admission: RE | Admit: 2011-04-22 | Discharge: 2011-04-22 | Disposition: A | Discharge status: Home / Self Care | Payer: BC Managed Care – PPO | Source: Ambulatory Visit | Attending: Family Medicine | Admitting: Family Medicine

## 2011-04-22 DIAGNOSIS — R1012 Left upper quadrant pain: Secondary | ICD-10-CM

## 2011-04-22 MED ORDER — IOHEXOL 300 MG/ML  SOLN
80.0000 mL | Freq: Once | INTRAMUSCULAR | Status: AC | PRN
  Administered 2011-04-22: 80 mL via INTRAVENOUS

## 2011-04-28 ENCOUNTER — Encounter: Payer: Self-pay | Admitting: Internal Medicine

## 2011-04-28 ENCOUNTER — Ambulatory Visit (INDEPENDENT_AMBULATORY_CARE_PROVIDER_SITE_OTHER): Payer: BC Managed Care – PPO | Admitting: Internal Medicine

## 2011-04-28 DIAGNOSIS — R1010 Upper abdominal pain, unspecified: Secondary | ICD-10-CM

## 2011-04-28 DIAGNOSIS — R109 Unspecified abdominal pain: Secondary | ICD-10-CM

## 2011-04-28 DIAGNOSIS — Z1211 Encounter for screening for malignant neoplasm of colon: Secondary | ICD-10-CM

## 2011-04-28 MED ORDER — PEG-KCL-NACL-NASULF-NA ASC-C 100 G PO SOLR: 1.0000 | Freq: Once | ORAL | Status: DC

## 2011-04-28 MED ORDER — ESOMEPRAZOLE MAGNESIUM 40 MG PO CPDR: 40.0000 mg | DELAYED_RELEASE_CAPSULE | Freq: Every day | ORAL | Status: DC

## 2011-04-28 NOTE — Progress Notes (Signed)
Subjective:    Patient ID: [name], female    DOB: Oct 02, 1950, 60 y.o.   MRN: [id number]  HPI This is a 60 year old Ms. Hirth is a 60 year old female seen in consultation at the request Dr. Dayton Martes for evaluation of left upper quadrant abdominal pain. The patient reports pain in her left midaxillary line which occasionally extends into her left back. She has noticed this intermittently for the past 2-3 years. This pain has been very unpredictable and when initially noticed it lasted 4-6 hours. Initially she noticed this once every few months, however last week the pain developed on Monday and lasted for 4 days. This episode was particularly severe and led her to see her PCP. The pain is sharp and nonradiating. At times she feels that food makes this better without any other obvious alleviating factor. She did report early a.m. belching but denies heartburn. No dysphagia or odynophagia. There was no nausea or vomiting. The pain did wake her from sleep on one occasion. She is unaware of any specific aggravating factor. There's also no relation between the pain and bowel movement. She reports her bowel movements are mostly loose and this has been long-standing (starting in her early 64s). She denies blood or melena in her stool. She's had no fevers chills or night sweats. Also she denies chest pain palpitations and dyspnea.   She was given samples of Nexium 40 mg daily and she started this roughly 5 days ago. She feels that this has helped both with her abdominal pain and her belching.  Review of Systems  Constitutional: Negative for chills, activity change, appetite change and unexpected weight change.  HENT: Negative for congestion, sore throat, mouth sores and trouble swallowing.   Eyes: Negative for visual disturbance.  Respiratory: Negative for cough, chest tightness and shortness of breath.   Cardiovascular: Negative for chest pain, palpitations and leg swelling.  Gastrointestinal:   See hpi  Genitourinary: Negative for dysuria and hematuria.  Musculoskeletal: Positive for back pain and arthralgias. Negative for myalgias and joint swelling.  Skin: Negative for color change and rash.  Neurological: Positive for headaches. Negative for weakness and numbness.  Hematological: Negative for adenopathy. Does not bruise/bleed easily.  Psychiatric/Behavioral: Positive for sleep disturbance. Negative for dysphoric mood. The patient is not nervous/anxious.    Past Medical History  Diagnosis Date  . Hyperlipidemia   . Osteoarthritis   . Depression   . Gallstones     Social History  . Marital Status: Married    Spouse Name: N/A    Number of Children: 4   Occupational History  . housewife    Social History Main Topics  . Smoking status: Former Smoker    Types: Cigarettes  . Smokeless tobacco: Never Used  . Alcohol Use: Yes     rarely  . Drug Use: No   Family History  Problem Relation Age of Onset  . Prostate cancer Father   . Diabetes Father   . Heart disease Father   . Hypertension Mother   . Stroke Mother   . Heart attack Paternal Grandfather   . Dementia Paternal Grandmother   . Ulcers Maternal Grandfather     bleeding  . Anuerysm Maternal Grandfather     AAA   -neg for CRC, polyps, IBD     Objective:   Physical Exam  Constitutional: She is oriented to person, place, and time. She appears well-developed and well-nourished. No distress.  HENT:  Head: Normocephalic and atraumatic.  Mouth/Throat: Oropharynx is  clear and moist. No oropharyngeal exudate.  Eyes: Conjunctivae are normal. Pupils are equal, round, and reactive to light. No scleral icterus.  Neck: Neck supple. No tracheal deviation present. No thyromegaly present.  Cardiovascular: Normal rate, regular rhythm and intact distal pulses.   Pulmonary/Chest: Effort normal and breath sounds normal. She has no wheezes. She has no rales.  Abdominal: Soft. Bowel sounds are normal. She exhibits no  distension and no mass. There is tenderness (mild epigastric and LUQ tenderness to palpation). There is no rebound and no guarding.  Lymphadenopathy:    She has no cervical adenopathy.  Neurological: She is alert and oriented to person, place, and time.  Skin: Skin is warm and dry. No rash noted.  Psychiatric: She has a normal mood and affect. Her behavior is normal.   BP 120/74  Pulse 68  Ht 5' 7.25" (1.708 m)  Wt 144 lb (65.318 kg)  BMI 22.39 kg/m2  Radiology (reviewed images) 04/22/11 Findings: The lung bases are clear. Bilateral breast prosthesis  are noted. The liver enhances with no focal abnormality and no  ductal dilatation is seen. Surgical clips are present from prior  cholecystectomy. The pancreas is normal in size and the pancreatic  duct is not dilated. The adrenal glands and spleen are  unremarkable. The stomach is not well distended with no  abnormality noted. The kidneys enhance with no calculus or mass  and no hydronephrosis is seen. Probable small cyst is noted in the  lower pole of the right kidney. The abdominal aorta is normal in  caliber. No adenopathy is seen.  Surgical sutures are present from prior appendectomy. No  abnormality of the colon is seen although the colon is largely  decompressed. The terminal ileum is unremarkable. The urinary  bladder is moderately distended with no abnormality noted. The  uterus has been resected. No fluid or mass is seen within the  pelvis. No bony abnormality is seen.  IMPRESSION:  1. No explanation for the patient's pain is seen.  2. No abdominal or pelvic mass or adenopathy is noted.   Lipase     Component Value Date/Time   LIPASE 28.0 04/21/2011 1214   CMET     Component Value Date/Time   NA 141 04/21/2011 1214   K 4.6 04/21/2011 1214   CL 104 04/21/2011 1214   CO2 28 04/21/2011 1214   GLUCOSE 116* 04/21/2011 1214   BUN 12 04/21/2011 1214   CREATININE 0.5 04/21/2011 1214   CALCIUM 9.3 04/21/2011 1214   PROT 7.0  04/21/2011 1214   ALBUMIN 4.4 04/21/2011 1214   AST 22 04/21/2011 1214   ALT 18 04/21/2011 1214   ALKPHOS 77 04/21/2011 1214   BILITOT 0.6 04/21/2011 1214      Assessment & Plan:  This is a 60 year old female seen in consultation for evaluation of epigastric and left upper quadrant pain.  1. Abd pain - the etiology of the patient's pain is unclear though it seemed to have responded somewhat to acid suppression. She does frequently use ibuprofen and so PUD is certainly possible. The CT result was unremarkable and this is reassuring (though certainly could miss mucosal lesions, such as ulcer disease).  I will schedule her for EGD for further evaluation. I have asked that she continue the Nexium 40 mg daily and we have discussed how best to take this medication. She was given a prescription for one-month supply with 5 refills.   2. CRC screening - the patient is agreeable to undergo colonoscopy  for colorectal cancer screening. This will be her first examination. She is deemed average risk.  The upper endoscopy and colonoscopy can be scheduled the same day. It seems that the loose stool she describes is long-standing and likely related to mild irritable bowel.  If the endoscopy and colonoscopy are unrevealing then we would consider a trial of anti-spasmodic for as needed pain. Followup in the office will be based on findings at endoscopy.

## 2011-04-28 NOTE — Patient Instructions (Addendum)
You have been scheduled for an Upper Endoscopy and Colonoscopy.  See separate instructions.  Prescriptions for Nexium and Moviprep have been sent to your pharmacy.    AV:[id number] Dayton Martes, MD

## 2011-05-15 ENCOUNTER — Other Ambulatory Visit: Payer: Self-pay | Admitting: Internal Medicine

## 2011-05-15 MED ORDER — ESOMEPRAZOLE MAGNESIUM 40 MG PO CPDR: 40.0000 mg | DELAYED_RELEASE_CAPSULE | Freq: Every day | ORAL | Status: DC

## 2011-05-15 NOTE — Telephone Encounter (Signed)
Pt reports [facility] never got the script for Nexium- they did receive the script for Movi-Prep. Informed pt I will resend the Nexium and apologized; pt stated understanding.

## 2011-06-05 ENCOUNTER — Encounter: Payer: Self-pay | Admitting: Internal Medicine

## 2011-06-05 ENCOUNTER — Ambulatory Visit (AMBULATORY_SURGERY_CENTER): Payer: BC Managed Care – PPO | Admitting: Internal Medicine

## 2011-06-05 VITALS — BP 121/104 | HR 82 | Temp 98.0°F | Resp 20 | Ht 67.0 in | Wt 144.0 lb

## 2011-06-05 DIAGNOSIS — D126 Benign neoplasm of colon, unspecified: Secondary | ICD-10-CM

## 2011-06-05 DIAGNOSIS — R109 Unspecified abdominal pain: Secondary | ICD-10-CM

## 2011-06-05 DIAGNOSIS — Z1211 Encounter for screening for malignant neoplasm of colon: Secondary | ICD-10-CM

## 2011-06-05 MED ORDER — SODIUM CHLORIDE 0.9 % IV SOLN: 500.0000 mL | INTRAVENOUS | Status: DC

## 2011-06-05 NOTE — Patient Instructions (Signed)
PLEASE FOLLOW DISCHARGE INSTRUCTIONS GIVEN TODAY. COLON POLYP X1 REMOVED TODAY AND SENT TO LAB, YOU WILL RECEIVE RESULT LETTER IN YOUR MAIL IN 1-2 WEEKS. ALSO DIVERTICULOSIS SEEN, FOLLOW HIGH FIBER DIET. SEE HANDOUTS GIVEN. NORMAL UPPER ENDOSCOPY TODAY. RESUME ALL YOUR CURRENT MEDICATIONS TODAY. CALL us WITH ANY QUESTIONS OR CONCERNS. THANK YOU!

## 2011-06-08 ENCOUNTER — Telehealth: Payer: Self-pay

## 2011-06-08 NOTE — Telephone Encounter (Signed)

## 2011-06-11 ENCOUNTER — Encounter: Payer: Self-pay | Admitting: Internal Medicine

## 2011-06-16 ENCOUNTER — Telehealth: Payer: Self-pay | Admitting: Internal Medicine

## 2011-06-16 NOTE — Telephone Encounter (Signed)
lmom for pt to call back

## 2011-06-16 NOTE — Telephone Encounter (Signed)
Notified pt of her path results and told her the holiday must have messed up the mail. She reports she still has pain in her l side and still has nausea; wants to know what to do next. Dr Rhea Belton, on 04/28/11 OV you mentioned if you didn't find anything on ECL, you may order anti-spasmodic. Please advise. Thanks.

## 2011-06-19 MED ORDER — ONDANSETRON HCL 4 MG PO TABS: ORAL_TABLET | ORAL | Status: DC

## 2011-06-19 MED ORDER — HYOSCYAMINE SULFATE 0.125 MG SL SUBL: SUBLINGUAL_TABLET | SUBLINGUAL | Status: DC

## 2011-06-19 NOTE — Telephone Encounter (Signed)
lmom for pt that we ordered something for spasms and nausea for her. Please call back today or Monday if not helping.

## 2011-06-19 NOTE — Telephone Encounter (Signed)
levsin 0.125 mg q4-6h prn pain/spasm Trial of zofran 4 mg po q6-8h prn nausea/ Hopefully she received the path letter by now. Thanks

## 2011-10-09 ENCOUNTER — Ambulatory Visit (INDEPENDENT_AMBULATORY_CARE_PROVIDER_SITE_OTHER): Payer: BC Managed Care – PPO | Admitting: Internal Medicine

## 2011-10-09 ENCOUNTER — Encounter: Payer: Self-pay | Admitting: Internal Medicine

## 2011-10-09 VITALS — BP 128/70 | HR 70 | Temp 97.9°F | Ht 67.5 in | Wt 143.0 lb

## 2011-10-09 DIAGNOSIS — M545 Low back pain, unspecified: Secondary | ICD-10-CM

## 2011-10-09 DIAGNOSIS — Z0000 Encounter for general adult medical examination without abnormal findings: Secondary | ICD-10-CM

## 2011-10-09 DIAGNOSIS — E785 Hyperlipidemia, unspecified: Secondary | ICD-10-CM

## 2011-10-09 DIAGNOSIS — J309 Allergic rhinitis, unspecified: Secondary | ICD-10-CM

## 2011-10-09 DIAGNOSIS — J301 Allergic rhinitis due to pollen: Secondary | ICD-10-CM | POA: Insufficient documentation

## 2011-10-09 LAB — LIPID PANEL
Cholesterol: 208 mg/dL — ABNORMAL HIGH (ref 0–200)
VLDL: 11.2 mg/dL (ref 0.0–40.0)

## 2011-10-09 LAB — LDL CHOLESTEROL, DIRECT: Direct LDL: 138.6 mg/dL

## 2011-10-09 MED ORDER — FLUTICASONE PROPIONATE 50 MCG/ACT NA SUSP: 2.0000 | Freq: Every day | NASAL | Status: DC

## 2011-10-09 NOTE — Assessment & Plan Note (Signed)
Will recheck Considering statins for primary prevention but no Rx for now

## 2011-10-09 NOTE — Assessment & Plan Note (Signed)
Uses ibuprofen prn

## 2011-10-09 NOTE — Assessment & Plan Note (Signed)
Healthy Working on fitness UTD with cancer screening

## 2011-10-09 NOTE — Assessment & Plan Note (Signed)
Will add inhaled steroid

## 2011-10-09 NOTE — Progress Notes (Signed)
Subjective:    Patient ID: [name], female    DOB: 1950/09/28, 61 y.o.   MRN: [id number]  HPI Doing okay No abdominal pain nexium seems to help and tries to keep meals small Had 1 polyp and will be on recall  Regular exercise Has protein shake for lunch Walks and uses medicine ball Has lost a little weight  Reviewed FH of CAD Not really early Mom also had stroke  Current Outpatient Prescriptions on File Prior to Visit  Medication Sig Dispense Refill  . cetirizine (ZYRTEC) 10 MG tablet Take 10 mg by mouth daily.        Marland Kitchen esomeprazole (NEXIUM) 40 MG capsule Take 1 capsule (40 mg total) by mouth daily.  30 capsule  5  . ibuprofen (ADVIL,MOTRIN) 200 MG tablet Take 200 mg by mouth every 6 (six) hours as needed.          Allergies  Allergen Reactions  . Lidocaine     REACTION: hives  . Meloxicam     REACTION: heart palpitations    Past Medical History  Diagnosis Date  . Hyperlipidemia   . Osteoarthritis   . Depression   . Gallstones   . Allergy     SEASONAL    Past Surgical History  Procedure Date  . Appendectomy   . Cholecystectomy 1990  . Tonsillectomy   . Partial vaginal hysterectomy   . Breast enhancement surgery   . Thumb arthroscopy     bilateral  . Knee arthroscopy     right    Family History  Problem Relation Age of Onset  . Prostate cancer Father   . Diabetes Father   . Heart disease Father   . Hypertension Mother   . Stroke Mother   . Heart attack Paternal Grandfather   . Dementia Paternal Grandmother   . Ulcers Maternal Grandfather     bleeding  . Anuerysm Maternal Grandfather     AAA    History   Social History  . Marital Status: Married    Spouse Name: N/A    Number of Children: 4  . Years of Education: N/A   Occupational History  . housewife    Social History Main Topics  . Smoking status: Former Smoker    Types: Cigarettes  . Smokeless tobacco: Never Used  . Alcohol Use: Yes     rarely  . Drug Use: No  .  Sexually Active: Not on file   Other Topics Concern  . Not on file   Social History Narrative  . No narrative on file   Review of Systems  Constitutional: Negative for fatigue and unexpected weight change.       Wears seat belt  HENT: Positive for congestion and rhinorrhea. Negative for hearing loss, dental problem and tinnitus.        Zyrtec helps allergy symptoms Regular with dentist  Eyes: Negative for visual disturbance.       No diplopia or unilateral vision loss  Respiratory: Negative for cough, chest tightness and shortness of breath.   Cardiovascular: Positive for palpitations. Negative for chest pain and leg swelling.       Still with palpitations regularly---mostly in bed or just at rest  Gastrointestinal: Negative for nausea, vomiting, abdominal pain, constipation and blood in stool.       Heartburn controlled on meds  Genitourinary: Positive for urgency and frequency. Negative for dyspareunia.  Musculoskeletal: Positive for back pain and arthralgias. Negative for joint swelling.  Back and hand pain at times---uses ibuprofen prn Using arch supports after apparent plantar fasciitis  Skin: Negative for rash.       Sees Dr Gwen Pounds yearly  Neurological: Positive for numbness and headaches. Negative for dizziness, syncope, weakness and light-headedness.       Occ postural light headedness if raises head quickly Gets headaches with barometric pressure changes--motrin helps Numbness on bottom of feet while on treadmill  Hematological: Negative for adenopathy. Does not bruise/bleed easily.  Psychiatric/Behavioral: Negative for sleep disturbance and dysphoric mood. The patient is not nervous/anxious.        Objective:   Physical Exam  Constitutional: She is oriented to person, place, and time. She appears well-developed and well-nourished. No distress.  HENT:  Head: Normocephalic and atraumatic.  Right Ear: External ear normal.  Left Ear: External ear normal.    Mouth/Throat: Oropharynx is clear and moist. No oropharyngeal exudate.       TMs normal  Eyes: Conjunctivae and EOM are normal. Pupils are equal, round, and reactive to light.  Neck: Normal range of motion. Neck supple. No thyromegaly present.  Cardiovascular: Normal rate, regular rhythm, normal heart sounds and intact distal pulses.  Exam reveals no gallop.   No murmur heard. Pulmonary/Chest: Effort normal and breath sounds normal. No respiratory distress. She has no wheezes. She has no rales.  Abdominal: Soft. There is no tenderness.  Genitourinary:       No breast masses Left implant more clearly palpable than right  Musculoskeletal: Normal range of motion. She exhibits no edema and no tenderness.  Lymphadenopathy:    She has no cervical adenopathy.    She has no axillary adenopathy.  Neurological: She is alert and oriented to person, place, and time.  Skin: No rash noted. No erythema.  Psychiatric: She has a normal mood and affect. Her behavior is normal. Judgment and thought content normal.          Assessment & Plan:

## 2011-10-14 ENCOUNTER — Telehealth: Payer: Self-pay | Admitting: *Deleted

## 2011-10-14 NOTE — Telephone Encounter (Signed)
letter mailed to patients home address with results.  

## 2011-10-14 NOTE — Telephone Encounter (Signed)
Patient is requesting a copy of her most recent labs be mailed to her home address.

## 2011-10-30 ENCOUNTER — Other Ambulatory Visit: Payer: Self-pay | Admitting: Internal Medicine

## 2012-01-28 ENCOUNTER — Other Ambulatory Visit: Payer: Self-pay | Admitting: Internal Medicine

## 2012-02-10 ENCOUNTER — Ambulatory Visit (INDEPENDENT_AMBULATORY_CARE_PROVIDER_SITE_OTHER): Payer: BC Managed Care – PPO | Admitting: Internal Medicine

## 2012-02-10 ENCOUNTER — Encounter: Payer: Self-pay | Admitting: Internal Medicine

## 2012-02-10 VITALS — BP 100/60 | HR 62 | Temp 98.2°F | Ht 67.0 in | Wt 132.0 lb

## 2012-02-10 DIAGNOSIS — M545 Low back pain, unspecified: Secondary | ICD-10-CM

## 2012-02-10 MED ORDER — CYCLOBENZAPRINE HCL 10 MG PO TABS: 5.0000 mg | ORAL_TABLET | Freq: Every evening | ORAL | Status: AC | PRN

## 2012-02-10 NOTE — Assessment & Plan Note (Signed)
Classic muscular back strain Undoubtedly from lifting grandson---discussed proper lifting techniques  Continue ibuprofen as needed Flexeril for bedtime

## 2012-02-10 NOTE — Progress Notes (Signed)
  Subjective:    Patient ID: [name], female    DOB: [birth year], 61 y.o.   MRN: [id number]  HPI Having back pain Worsening of previous pain Awakens her at night--hard to get comfortable Feels like it "pinches and grabs" when she bends over Started almost 2 weeks ago  No clear cut injury but had been babysitting grandchildren---picking up 30# grandson frequently before pain worsened Some spasm type pain in April  Mid lumbar pain on both sides Down to hips No radiation up back or into legs No weakness or parasthesias in legs Notes more pain with twisting or bending  Ibuprofen 800mg  in AM---helps most of the day  Current Outpatient Prescriptions on File Prior to Visit  Medication Sig Dispense Refill  . cetirizine (ZYRTEC) 10 MG tablet Take 10 mg by mouth daily.        . fluticasone (FLONASE) 50 MCG/ACT nasal spray Place 2 sprays into the nose daily. In each nostril  16 g  12  . ibuprofen (ADVIL,MOTRIN) 200 MG tablet Take 200 mg by mouth every 6 (six) hours as needed.        . loperamide (IMODIUM A-D) 2 MG tablet Take 2 mg by mouth daily.        Allergies  Allergen Reactions  . Lidocaine     REACTION: hives  . Meloxicam     REACTION: heart palpitations    Past Medical History  Diagnosis Date  . Hyperlipidemia   . Osteoarthritis   . Depression   . Gallstones   . Allergy     SEASONAL    Past Surgical History  Procedure Date  . Appendectomy   . Cholecystectomy 1990  . Tonsillectomy   . Partial vaginal hysterectomy   . Breast enhancement surgery   . Thumb arthroscopy     bilateral  . Knee arthroscopy     right    Family History  Problem Relation Age of Onset  . Prostate cancer Father   . Diabetes Father   . Heart disease Father   . Hypertension Mother   . Stroke Mother   . Heart attack Paternal Grandfather   . Dementia Paternal Grandmother   . Ulcers Maternal Grandfather     bleeding  . Anuerysm Maternal Grandfather     AAA    History    Social History  . Marital Status: Married    Spouse Name: N/A    Number of Children: 4  . Years of Education: N/A   Occupational History  . housewife    Social History Main Topics  . Smoking status: Former Smoker    Types: Cigarettes  . Smokeless tobacco: Never Used  . Alcohol Use: Yes     rarely  . Drug Use: No  . Sexually Active: Not on file   Other Topics Concern  . Not on file   Social History Narrative  . No narrative on file   Review of Systems No change in bowel or bladder function No stomach trouble Has lost 10# with protein shakes     Objective:   Physical Exam  Constitutional: She appears well-developed and well-nourished. No distress.  Musculoskeletal:       No spine or hip tenderness Normal spine flexion SLR negative Excellent ROM of hips  Neurological:       Normal strength and gait          Assessment & Plan:

## 2012-02-12 ENCOUNTER — Encounter: Payer: Self-pay | Admitting: Internal Medicine

## 2012-02-15 ENCOUNTER — Encounter: Payer: Self-pay | Admitting: *Deleted

## 2012-03-04 ENCOUNTER — Ambulatory Visit (INDEPENDENT_AMBULATORY_CARE_PROVIDER_SITE_OTHER): Payer: BC Managed Care – PPO | Admitting: Internal Medicine

## 2012-03-04 ENCOUNTER — Encounter: Payer: Self-pay | Admitting: Internal Medicine

## 2012-03-04 VITALS — BP 118/70 | HR 73 | Temp 97.9°F | Ht 67.0 in | Wt 133.0 lb

## 2012-03-04 DIAGNOSIS — M545 Low back pain, unspecified: Secondary | ICD-10-CM

## 2012-03-04 MED ORDER — PREDNISONE 20 MG PO TABS: 40.0000 mg | ORAL_TABLET | Freq: Every day | ORAL | Status: AC

## 2012-03-04 NOTE — Progress Notes (Signed)
  Subjective:    Patient ID: [name], female    DOB: Jul 28, 1951, 61 y.o.   MRN: [id number]  HPI Back is a little worse In past 3 days has used the flexeril bid and ice packs Painful if standing Sitting okay Doing okay at night with flexeril and ibuprofen  Has taken it easy--no lifting or house work Occ "knife-like" pain with sneezing  No radiation of pain No leg weakness  Current Outpatient Prescriptions on File Prior to Visit  Medication Sig Dispense Refill  . cetirizine (ZYRTEC) 10 MG tablet Take 10 mg by mouth daily.        . fluticasone (FLONASE) 50 MCG/ACT nasal spray Place 2 sprays into the nose daily. In each nostril  16 g  12  . ibuprofen (ADVIL,MOTRIN) 200 MG tablet Take 200 mg by mouth every 6 (six) hours as needed.        . loperamide (IMODIUM A-D) 2 MG tablet Take 2 mg by mouth daily.      . Multiple Vitamin (MULTIVITAMIN) tablet Take 1 tablet by mouth daily.        Allergies  Allergen Reactions  . Lidocaine     REACTION: hives  . Meloxicam     REACTION: heart palpitations    Past Medical History  Diagnosis Date  . Hyperlipidemia   . Osteoarthritis   . Depression   . Gallstones   . Allergy     SEASONAL    Past Surgical History  Procedure Date  . Appendectomy   . Cholecystectomy 1990  . Tonsillectomy   . Partial vaginal hysterectomy   . Breast enhancement surgery   . Thumb arthroscopy     bilateral  . Knee arthroscopy     right    Family History  Problem Relation Age of Onset  . Prostate cancer Father   . Diabetes Father   . Heart disease Father   . Hypertension Mother   . Stroke Mother   . Heart attack Paternal Grandfather   . Dementia Paternal Grandmother   . Ulcers Maternal Grandfather     bleeding  . Anuerysm Maternal Grandfather     AAA    History   Social History  . Marital Status: Married    Spouse Name: N/A    Number of Children: 4  . Years of Education: N/A   Occupational History  . housewife    Social  History Main Topics  . Smoking status: Former Smoker    Types: Cigarettes  . Smokeless tobacco: Never Used  . Alcohol Use: Yes     rarely  . Drug Use: No  . Sexually Active: Not on file   Other Topics Concern  . Not on file   Social History Narrative  . No narrative on file   Review of Systems No change in bowel or bladder function    Objective:   Physical Exam  Constitutional: She appears well-developed and well-nourished. No distress.  Musculoskeletal:       Very little back flexion Tenderness along left lumbar paraspinals but none along spine SLR negative bilaterally  Neurological:       Normal gait No weakness in legs          Assessment & Plan:

## 2012-03-04 NOTE — Assessment & Plan Note (Signed)
Not improving  Still seems to be only muscular Will try prednisone burst PT eval if not better in 1-2 weeks

## 2012-03-04 NOTE — Patient Instructions (Signed)
Please call if you are not better in the next week or 2---I will make referral to physical therapy

## 2012-10-12 ENCOUNTER — Encounter: Payer: Self-pay | Admitting: Internal Medicine

## 2012-10-12 ENCOUNTER — Ambulatory Visit (INDEPENDENT_AMBULATORY_CARE_PROVIDER_SITE_OTHER): Payer: BC Managed Care – PPO | Admitting: Internal Medicine

## 2012-10-12 VITALS — BP 122/80 | HR 78 | Temp 97.0°F | Ht 67.5 in | Wt 137.0 lb

## 2012-10-12 DIAGNOSIS — E785 Hyperlipidemia, unspecified: Secondary | ICD-10-CM

## 2012-10-12 DIAGNOSIS — Z0000 Encounter for general adult medical examination without abnormal findings: Secondary | ICD-10-CM

## 2012-10-12 DIAGNOSIS — M1990 Unspecified osteoarthritis, unspecified site: Secondary | ICD-10-CM

## 2012-10-12 LAB — BASIC METABOLIC PANEL
CO2: 30 mEq/L (ref 19–32)
Chloride: 104 mEq/L (ref 96–112)
Creatinine, Ser: 0.8 mg/dL (ref 0.4–1.2)
Glucose, Bld: 105 mg/dL — ABNORMAL HIGH (ref 70–99)
Sodium: 140 mEq/L (ref 135–145)

## 2012-10-12 LAB — CBC WITH DIFFERENTIAL/PLATELET
Basophils Relative: 0.6 % (ref 0.0–3.0)
Eosinophils Absolute: 0.1 10*3/uL (ref 0.0–0.7)
Hemoglobin: 15 g/dL (ref 12.0–15.0)
Lymphs Abs: 1.9 10*3/uL (ref 0.7–4.0)
MCHC: 33.9 g/dL (ref 30.0–36.0)
MCV: 90.9 fl (ref 78.0–100.0)
Monocytes Absolute: 0.5 10*3/uL (ref 0.1–1.0)
Neutro Abs: 3.5 10*3/uL (ref 1.4–7.7)
RBC: 4.87 Mil/uL (ref 3.87–5.11)

## 2012-10-12 LAB — LIPID PANEL
Cholesterol: 225 mg/dL — ABNORMAL HIGH (ref 0–200)
Total CHOL/HDL Ratio: 4

## 2012-10-12 LAB — HEPATIC FUNCTION PANEL
ALT: 21 U/L (ref 0–35)
Albumin: 4.2 g/dL (ref 3.5–5.2)
Total Protein: 7 g/dL (ref 6.0–8.3)

## 2012-10-12 LAB — LDL CHOLESTEROL, DIRECT: Direct LDL: 132 mg/dL

## 2012-10-12 NOTE — Assessment & Plan Note (Signed)
Back and hips Hip exam is benign Discussed OTC analgesics and starting exercise routine

## 2012-10-12 NOTE — Assessment & Plan Note (Signed)
Mild Overall risk is low so no meds Will recheck

## 2012-10-12 NOTE — Assessment & Plan Note (Addendum)
Healthy Discussed fitness Mammo every 2 years No varivax due to egg allergy

## 2012-10-12 NOTE — Progress Notes (Signed)
Subjective:    Patient ID: [name], female    DOB: 1950-12-28, 62 y.o.   MRN: [id number]  HPI Here for physical Doing protein shake at lunch "diet"---has lost a few pounds No regular exercise--relates to problems with hip bursitis  Had left side pain---had EGD by Dr Rhea Belton Still gets the pain at times When that happens---stools are thinner No blood Current Outpatient Prescriptions on File Prior to Visit  Medication Sig Dispense Refill  . cetirizine (ZYRTEC) 10 MG tablet Take 10 mg by mouth daily.        . fluticasone (FLONASE) 50 MCG/ACT nasal spray Place 2 sprays into the nose daily. In each nostril      . ibuprofen (ADVIL,MOTRIN) 200 MG tablet Take 200 mg by mouth every 6 (six) hours as needed.        . loperamide (IMODIUM A-D) 2 MG tablet Take 2 mg by mouth daily as needed.       . Multiple Vitamin (MULTIVITAMIN) tablet Take 1 tablet by mouth daily.        Allergies  Allergen Reactions  . Lidocaine     REACTION: hives  . Meloxicam     REACTION: heart palpitations    Past Medical History  Diagnosis Date  . Hyperlipidemia   . Osteoarthritis   . Depression   . Gallstones   . Allergy     SEASONAL    Past Surgical History  Procedure Date  . Appendectomy   . Cholecystectomy 1990  . Tonsillectomy   . Partial vaginal hysterectomy   . Breast enhancement surgery   . Thumb arthroscopy     bilateral  . Knee arthroscopy     right    Family History  Problem Relation Age of Onset  . Prostate cancer Father   . Diabetes Father   . Heart disease Father   . Hypertension Mother   . Stroke Mother   . Heart attack Paternal Grandfather   . Dementia Paternal Grandmother   . Ulcers Maternal Grandfather     bleeding  . Anuerysm Maternal Grandfather     AAA    History   Social History  . Marital Status: Married    Spouse Name: N/A    Number of Children: 4  . Years of Education: N/A   Occupational History  . housewife    Social History Main Topics  .  Smoking status: Former Smoker    Types: Cigarettes  . Smokeless tobacco: Never Used  . Alcohol Use: Yes     Comment: rarely  . Drug Use: No  . Sexually Active: Not on file   Other Topics Concern  . Not on file   Social History Narrative  . No narrative on file   Review of Systems  Constitutional: Negative for fatigue and unexpected weight change.       Wears seat belt  HENT: Positive for congestion and rhinorrhea. Negative for hearing loss, dental problem and tinnitus.        Regular with dentist Satisfied with allergy Rx  Eyes: Negative for visual disturbance.       No diplopia or unilateral vision loss  Respiratory: Positive for cough. Negative for chest tightness and shortness of breath.        Occ AM cough to clear drainage  Cardiovascular: Positive for palpitations. Negative for chest pain and leg swelling.       Occ sensation of heart going fast---sitting after eating generally  Gastrointestinal: Positive for abdominal pain. Negative  for nausea, vomiting, constipation and blood in stool.       No heartburn  Genitourinary: Negative for difficulty urinating and dyspareunia.       No incontinence  Musculoskeletal: Positive for back pain and arthralgias. Negative for joint swelling.       Back and hip pain Thumb pain is better since surgery  Skin: Negative for rash.       No suspicious areas  Neurological: Positive for tremors and headaches. Negative for dizziness, syncope, weakness, light-headedness and numbness.       Mild hand tremor--like doing makeup. Rare left ulnar tingling Occ sinus headaches  Hematological: Negative for adenopathy. Does not bruise/bleed easily.  Psychiatric/Behavioral: Positive for sleep disturbance. Negative for dysphoric mood. The patient is not nervous/anxious.        Chronic poor sleep but no real change.       Objective:   Physical Exam  Constitutional: She is oriented to person, place, and time. She appears well-developed and  well-nourished. No distress.  HENT:  Head: Normocephalic and atraumatic.  Right Ear: External ear normal.  Left Ear: External ear normal.  Mouth/Throat: Oropharynx is clear and moist. No oropharyngeal exudate.  Eyes: Conjunctivae normal and EOM are normal. Pupils are equal, round, and reactive to light.  Neck: Normal range of motion. Neck supple. No thyromegaly present.  Cardiovascular: Normal rate, regular rhythm, normal heart sounds and intact distal pulses.  Exam reveals no gallop.   No murmur heard. Pulmonary/Chest: Effort normal and breath sounds normal. No respiratory distress. She has no wheezes. She has no rales.  Abdominal: Soft. There is no tenderness.  Genitourinary:       No breast masses. Implants in position Pelvic exam shows absent uterus--no tenderness or adnexal masses  Musculoskeletal: She exhibits no edema and no tenderness.  Lymphadenopathy:    She has no cervical adenopathy.    She has no axillary adenopathy.  Neurological: She is alert and oriented to person, place, and time.  Skin: Skin is warm. No rash noted. No erythema.  Psychiatric: She has a normal mood and affect. Her behavior is normal.          Assessment & Plan:

## 2012-10-13 ENCOUNTER — Encounter: Payer: Self-pay | Admitting: *Deleted

## 2012-11-01 ENCOUNTER — Ambulatory Visit (INDEPENDENT_AMBULATORY_CARE_PROVIDER_SITE_OTHER)
Admission: RE | Admit: 2012-11-01 | Discharge: 2012-11-01 | Disposition: A | Discharge status: Home / Self Care | Payer: BC Managed Care – PPO | Source: Ambulatory Visit | Attending: Internal Medicine | Admitting: Internal Medicine

## 2012-11-01 ENCOUNTER — Encounter: Payer: Self-pay | Admitting: Internal Medicine

## 2012-11-01 ENCOUNTER — Telehealth: Payer: Self-pay | Admitting: Internal Medicine

## 2012-11-01 ENCOUNTER — Ambulatory Visit (INDEPENDENT_AMBULATORY_CARE_PROVIDER_SITE_OTHER): Payer: BC Managed Care – PPO | Admitting: Internal Medicine

## 2012-11-01 VITALS — BP 124/66 | HR 80 | Ht 67.5 in | Wt 138.0 lb

## 2012-11-01 DIAGNOSIS — R109 Unspecified abdominal pain: Secondary | ICD-10-CM

## 2012-11-01 DIAGNOSIS — R0789 Other chest pain: Secondary | ICD-10-CM

## 2012-11-01 DIAGNOSIS — M940 Chondrocostal junction syndrome [Tietze]: Secondary | ICD-10-CM

## 2012-11-01 DIAGNOSIS — R071 Chest pain on breathing: Secondary | ICD-10-CM

## 2012-11-01 DIAGNOSIS — Z86010 Personal history of colonic polyps: Secondary | ICD-10-CM | POA: Insufficient documentation

## 2012-11-01 DIAGNOSIS — Z860101 Personal history of adenomatous and serrated colon polyps: Secondary | ICD-10-CM

## 2012-11-01 MED ORDER — METHYLPREDNISOLONE 4 MG PO KIT: PACK | ORAL | Status: DC

## 2012-11-01 MED ORDER — TRAMADOL HCL 50 MG PO TABS: 50.0000 mg | ORAL_TABLET | Freq: Four times a day (QID) | ORAL | Status: DC | PRN

## 2012-11-01 NOTE — Telephone Encounter (Signed)
Pt c/o pain r side under her ribs x 4 days; up all night with the pain. At first the pain started in her back and radiated to the front in under her r rib area. She denies constipation or diarrhea and no fever; she has had an increase in burping. Pt had ECL 06/02/11 with mild diverticulosis in Sigmoid area, adenomatous polyp and normal EGD. Pt will see Dr Rhea Belton today.

## 2012-11-01 NOTE — Patient Instructions (Addendum)
Please go down to Radiology in the basement for x-rays  We have sent the following medications to your pharmacy for you to pick up at your convenience: Tramodol and Medrol; please take medications as directed

## 2012-11-01 NOTE — Progress Notes (Signed)
Subjective:    Patient ID: [name], female    DOB: 1951-07-20, 62 y.o.   MRN: [id number]  HPI Mrs. [name] is a 62 year old female with a past medical history of osteoarthritis, hyperlipidemia, and adenomatous colon polyp who seen in followup for evaluation of a new right upper quadrant/right chest wall pain.  The patient reports that Saturday (3 days ago) she developed right sided upper abdominal pain in the midaxillary line. This pain radiates ever so slightly anteriorly. The pain initially was minor but has progressed and is now constant pain. It is worse with movement and slightly worse with cough. She has a history of rhinitis and intermittent coughing with that, but the cough is not new nor productive. The pain is not worse with deep breath. She's had no nausea. Possibly a slight increase in belching. No change in the pain related to eating or bowel movement. She is able to eat without nausea or vomiting. No early satiety. No fever or chills. No heartburn. She did try Tylenol and ibuprofen without relief. Again, the pain as, sent in nature and seems to be worse at night when she is trying to sleep. She's had no dyspnea or anterior chest pain. When she was last seen she reported chronic loose stools, but she started loperamide 2 mg daily and this has drastically improved her bowel habits. She denies blood in her stool or melena.  No jaundice, itching.  No abdominal distention or lower extremity edema.  She has had a prior cholecystectomy and hysterectomy.  There's been no trauma or falls.   Review of Systems As per history of present illness, otherwise negative  Current Medications, Allergies, Past Medical History, Past Surgical History, Family History and Social History were reviewed in [employer] record.     Objective:   Physical Exam BP 124/66  Pulse 80  Ht 5' 7.5" (1.715 m)  Wt 138 lb (62.596 kg)  BMI 21.28 kg/m2 Constitutional: Well-developed and  well-nourished. No distress. HEENT: Normocephalic and atraumatic.  Conjunctivae are normal.  No scleral icterus. Cardiovascular: Normal rate, regular rhythm and intact distal pulses. No M/R/G Pulmonary/chest: Effort normal and breath sounds normal. No wheezing, rales or rhonchi. There is tenderness to palpation in the midaxillary line with pressure over the rib cage.  There is no rash or allodynia Abdominal: Soft, nontender, nondistended. Bowel sounds active throughout. There are no masses palpable. No hepatosplenomegaly. Extremities: no clubbing, cyanosis, or edema Lymphadenopathy: No cervical adenopathy noted. Neurological: Alert and oriented to person place and time. Skin: Skin is warm and dry. No rashes noted. Psychiatric: Normal mood and affect. Behavior is normal.  CMP     Component Value Date/Time   NA 140 10/12/2012 0931   K 4.5 10/12/2012 0931   CL 104 10/12/2012 0931   CO2 30 10/12/2012 0931   GLUCOSE 105* 10/12/2012 0931   BUN 16 10/12/2012 0931   CREATININE 0.8 10/12/2012 0931   CALCIUM 9.4 10/12/2012 0931   PROT 7.0 10/12/2012 0931   ALBUMIN 4.2 10/12/2012 0931   AST 21 10/12/2012 0931   ALT 21 10/12/2012 0931   ALKPHOS 84 10/12/2012 0931   BILITOT 0.9 10/12/2012 0931   GFRNONAA 104.89 07/14/2010 1306   GFRAA 111 08/21/2008 0853    CBC    Component Value Date/Time   WBC 6.0 10/12/2012 0931   RBC 4.87 10/12/2012 0931   HGB 15.0 10/12/2012 0931   HCT 44.3 10/12/2012 0931   PLT 204.0 10/12/2012 0931   MCV 90.9  10/12/2012 0931   MCHC 33.9 10/12/2012 0931   RDW 12.8 10/12/2012 0931   LYMPHSABS 1.9 10/12/2012 0931   MONOABS 0.5 10/12/2012 0931   EOSABS 0.1 10/12/2012 0931   BASOSABS 0.0 10/12/2012 0931   CT ABDOMEN AND PELVIS WITH CONTRAST -- August 2012   Technique:  Multidetector CT imaging of the abdomen and pelvis was performed following the standard protocol during bolus administration of intravenous contrast.   Contrast: 80 ml Omnipaque-300   Comparison: CT abdomen and pelvis of 01/14/2005    Findings: The lung bases are clear.  Bilateral breast prosthesis are noted.  The liver enhances with no focal abnormality and no ductal dilatation is seen.  Surgical clips are present from prior cholecystectomy.  The pancreas is normal in size and the pancreatic duct is not dilated.  The adrenal glands and spleen are unremarkable.  The stomach is not well distended with no abnormality noted.  The kidneys enhance with no calculus or mass and no hydronephrosis is seen.  Probable small cyst is noted in the lower pole of the right kidney.  The abdominal aorta is normal in caliber.  No adenopathy is seen.   Surgical sutures are present from prior appendectomy.  No abnormality of the colon is seen although the colon is largely decompressed.  The terminal ileum is unremarkable.  The urinary bladder is moderately distended with no abnormality noted.  The uterus has been resected.  No fluid or mass is seen within the pelvis. No bony abnormality is seen.   IMPRESSION:   1.  No explanation for the patient's pain is seen. 2.  No abdominal or pelvic mass or adenopathy is noted.      Assessment & Plan:   61 year old female with a past medical history of osteoarthritis, hyperlipidemia, and adenomatous colon polyp who seen in followup for evaluation of a new right upper quadrant/right chest wall pain.  1.  Right chest wall pain/possible costochondritis -- the patient is tender of the rib cage raising question of costochondritis. There is no trauma to suspect muscle injury or rib fracture.  This does not seem to be liver type pain, and she has had a prior cholecystectomy. He recently had labs performed at her yearly physical and they were reassuring. Prior CT scan from 2012 is reviewed and also unremarkable.  I recommended a Medrol Dosepak for costochondritis and will give her tramadol 50-100 mg every 8 hours as needed for pain. If her pain is not significantly improved I would likely repeat a right  upper quadrant ultrasound and repeat labs. We'll also perform a chest x-ray and right rib films today. She will call us later in the week and update [place] on her symptoms. She is planning a two-week vacation to [place] leaving next Wednesday, so hopefully her symptoms will improve before then.  2.  History of adenomatous colon polyp -- she did have one adenomatous colon polyp on her colonoscopy in September 2012, she is aware of the recommendation to repeat the colonoscopy in September 2017

## 2012-11-02 ENCOUNTER — Telehealth: Payer: Self-pay | Admitting: Internal Medicine

## 2012-11-02 NOTE — Telephone Encounter (Signed)
Informed pt of normal chest XRAY per Dr Rhea Belton. She reports itching and being awake all night with Ultram; listed as an allergy and asked pt to stop using the drug. Pt stated understanding.

## 2012-11-04 ENCOUNTER — Telehealth: Payer: Self-pay | Admitting: Internal Medicine

## 2012-11-04 NOTE — Telephone Encounter (Signed)
Pt reports her pain is better after starting the steroids. She has had a normal BM for the 1st time in a while; larger in shape and color, but it was 2 tones of brown. Pt was to call in today with an update per Dr Lauro Franklin notes to determine whether pt needed labs and U/S repeated and pt wants to know if that's needed. Spoke with Mike Gip, PA who stated since she's better, we will not do further testing at this time. I will send this to Dr Rhea Belton to address on Monday and call her if he has any changes. She is to call the on call number over the weekend for problems. Pt stated understanding.

## 2013-02-17 ENCOUNTER — Encounter: Payer: Self-pay | Admitting: Internal Medicine

## 2013-05-22 ENCOUNTER — Ambulatory Visit (INDEPENDENT_AMBULATORY_CARE_PROVIDER_SITE_OTHER): Payer: BC Managed Care – PPO | Admitting: Internal Medicine

## 2013-05-22 ENCOUNTER — Encounter: Payer: Self-pay | Admitting: Internal Medicine

## 2013-05-22 VITALS — BP 120/80 | HR 78 | Temp 98.0°F | Wt 143.0 lb

## 2013-05-22 DIAGNOSIS — J019 Acute sinusitis, unspecified: Secondary | ICD-10-CM

## 2013-05-22 MED ORDER — BENZONATATE 200 MG PO CAPS: 200.0000 mg | ORAL_CAPSULE | Freq: Three times a day (TID) | ORAL | Status: DC | PRN

## 2013-05-22 MED ORDER — AMOXICILLIN 500 MG PO TABS: 1000.0000 mg | ORAL_TABLET | Freq: Two times a day (BID) | ORAL | Status: DC

## 2013-05-22 NOTE — Progress Notes (Signed)
Subjective:    Patient ID: [name], female    DOB: 11-28-1950, 62 y.o.   MRN: [id number]  HPI Sinus pressure Left cervical node Sore throat and voice is off Some fever for the last few days Started 3-4 days ago with sore throat---then progressed--while on cruise  Now with yellow drainage from nose Post nasal drip with yellow sputum with cough (in AM)  Has used nasal spray and zyrtec  Current Outpatient Prescriptions on File Prior to Visit  Medication Sig Dispense Refill  . cetirizine (ZYRTEC) 10 MG tablet Take 10 mg by mouth daily.        . fluticasone (FLONASE) 50 MCG/ACT nasal spray Place 2 sprays into the nose daily. In each nostril      . ibuprofen (ADVIL,MOTRIN) 200 MG tablet Take 200 mg by mouth every 6 (six) hours as needed.        . loperamide (IMODIUM A-D) 2 MG tablet Take 2 mg by mouth daily as needed.       . Multiple Vitamin (MULTIVITAMIN) tablet Take 1 tablet by mouth daily.       No current facility-administered medications on file prior to visit.    Allergies  Allergen Reactions  . Lidocaine     REACTION: hives  . Meloxicam     REACTION: heart palpitations  . Ultram [Tramadol] Other (See Comments)    Pt reports ultram kept her awake all night    Past Medical History  Diagnosis Date  . Hyperlipidemia   . Osteoarthritis   . Depression   . Gallstones   . Allergy     SEASONAL  . Adenomatous colon polyp     Past Surgical History  Procedure Laterality Date  . Appendectomy    . Cholecystectomy  1990  . Tonsillectomy    . Partial vaginal hysterectomy    . Breast enhancement surgery    . Thumb arthroscopy      bilateral  . Knee arthroscopy      right    Family History  Problem Relation Age of Onset  . Prostate cancer Father   . Diabetes Father   . Heart disease Father   . Hypertension Mother   . Stroke Mother   . Heart attack Paternal Grandfather   . Dementia Paternal Grandmother   . Ulcers Maternal Grandfather     bleeding  .  Anuerysm Maternal Grandfather     AAA    History   Social History  . Marital Status: Married    Spouse Name: N/A    Number of Children: 4  . Years of Education: N/A   Occupational History  . housewife    Social History Main Topics  . Smoking status: Former Smoker    Types: Cigarettes  . Smokeless tobacco: Never Used  . Alcohol Use: Yes     Comment: rarely  . Drug Use: No  . Sexual Activity: Not on file   Other Topics Concern  . Not on file   Social History Narrative  . No narrative on file   Review of Systems Rash on legs---thinks it was reaction to soap on cruise No vomiting or diarrhea appetite is off some     Objective:   Physical Exam  Constitutional: She appears well-developed and well-nourished. No distress.  HENT:  No sinus tenderness TMs normal Mild nasal inflammation Slight pharyngeal injection  Neck: Normal range of motion. Neck supple. No thyromegaly present.  Pulmonary/Chest: Effort normal and breath sounds normal. No respiratory distress.  She has no wheezes. She has no rales.  Lymphadenopathy:    She has no cervical adenopathy.          Assessment & Plan:

## 2013-05-22 NOTE — Assessment & Plan Note (Signed)
Still may be viral Discussed supportive care Will start amoxil if worsens

## 2013-06-16 ENCOUNTER — Other Ambulatory Visit: Payer: Self-pay | Admitting: Internal Medicine

## 2013-06-26 ENCOUNTER — Encounter (INDEPENDENT_AMBULATORY_CARE_PROVIDER_SITE_OTHER): Payer: Self-pay

## 2013-06-26 ENCOUNTER — Ambulatory Visit (INDEPENDENT_AMBULATORY_CARE_PROVIDER_SITE_OTHER): Payer: BC Managed Care – PPO | Admitting: Internal Medicine

## 2013-06-26 ENCOUNTER — Encounter: Payer: Self-pay | Admitting: Internal Medicine

## 2013-06-26 VITALS — BP 120/80 | HR 84 | Temp 97.5°F | Wt 144.0 lb

## 2013-06-26 DIAGNOSIS — M545 Low back pain, unspecified: Secondary | ICD-10-CM

## 2013-06-26 NOTE — Assessment & Plan Note (Signed)
Along with recurrent right hip tendonitis May have slight leg length discrepancy---left ?slightly longer No apparent scoliosis No concerns on neuro exam  Okay to continue ibuprofen Will refer to Dr Patsy Lager to review biomechanics to see if that is causing multiple symptoms

## 2013-06-26 NOTE — Progress Notes (Signed)
Subjective:    Patient ID: [name], female    DOB: 16-Jan-1951, 62 y.o.   MRN: [id number]  HPI Started with back pain at the end of last week Across lumbar back and to right flank Actually could feel into the left heel Then some hip pain and radiating into her groin-- usually not both at the same time Some tenderness along lumbar spine  Did have past bursitis in right hip Related to working in heels Still has to ice after walking on the treadmill  Has had fairly consistent right hip pain over a couple of years Scattered other pain areas  Takes ibuprofen 600mg  up to bid Helps but doesn't like having to take it that much  Current Outpatient Prescriptions on File Prior to Visit  Medication Sig Dispense Refill  . cetirizine (ZYRTEC) 10 MG tablet Take 10 mg by mouth daily.        . fluticasone (FLONASE) 50 MCG/ACT nasal spray USE 2 SPRAYS INTO EACH NOSTRIL ONCE DAILY  16 g  5  . ibuprofen (ADVIL,MOTRIN) 200 MG tablet Take 200 mg by mouth every 6 (six) hours as needed.        . loperamide (IMODIUM A-D) 2 MG tablet Take 2 mg by mouth daily as needed.       . Multiple Vitamin (MULTIVITAMIN) tablet Take 1 tablet by mouth daily.       No current facility-administered medications on file prior to visit.    Allergies  Allergen Reactions  . Lidocaine     REACTION: hives  . Meloxicam     REACTION: heart palpitations  . Ultram [Tramadol] Other (See Comments)    Pt reports ultram kept her awake all night    Past Medical History  Diagnosis Date  . Hyperlipidemia   . Osteoarthritis   . Depression   . Gallstones   . Allergy     SEASONAL  . Adenomatous colon polyp     Past Surgical History  Procedure Laterality Date  . Appendectomy    . Cholecystectomy  1990  . Tonsillectomy    . Partial vaginal hysterectomy    . Breast enhancement surgery    . Thumb arthroscopy      bilateral  . Knee arthroscopy      right    Family History  Problem Relation Age of Onset  .  Prostate cancer Father   . Diabetes Father   . Heart disease Father   . Hypertension Mother   . Stroke Mother   . Heart attack Paternal Grandfather   . Dementia Paternal Grandmother   . Ulcers Maternal Grandfather     bleeding  . Anuerysm Maternal Grandfather     AAA    History   Social History  . Marital Status: Married    Spouse Name: N/A    Number of Children: 4  . Years of Education: N/A   Occupational History  . housewife    Social History Main Topics  . Smoking status: Former Smoker    Types: Cigarettes  . Smokeless tobacco: Never Used  . Alcohol Use: Yes     Comment: rarely  . Drug Use: No  . Sexual Activity: Not on file   Other Topics Concern  . Not on file   Social History Narrative  . No narrative on file   Review of Systems Uses "running shoes" with orthotics No leg weakness May have left knee pain if walks on hills    Objective:   Physical  Exam  Musculoskeletal:  No joint swelling No back tenderness Normal back flexion and full hip rotation bilaterally Moderate right trochanteric bursa tenderness  Neurological:  Normal gait No focal leg weakness          Assessment & Plan:

## 2013-06-26 NOTE — Patient Instructions (Signed)
Please set up a consultation with Dr Patsy Lager to review back and right hip pain

## 2013-07-03 ENCOUNTER — Ambulatory Visit (INDEPENDENT_AMBULATORY_CARE_PROVIDER_SITE_OTHER): Payer: BC Managed Care – PPO | Admitting: Family Medicine

## 2013-07-03 ENCOUNTER — Encounter: Payer: Self-pay | Admitting: Family Medicine

## 2013-07-03 VITALS — BP 130/80 | HR 71 | Temp 99.2°F | Ht 67.5 in | Wt 143.2 lb

## 2013-07-03 DIAGNOSIS — M2170 Unequal limb length (acquired), unspecified site: Secondary | ICD-10-CM

## 2013-07-03 DIAGNOSIS — M549 Dorsalgia, unspecified: Secondary | ICD-10-CM

## 2013-07-03 DIAGNOSIS — M7061 Trochanteric bursitis, right hip: Secondary | ICD-10-CM

## 2013-07-03 DIAGNOSIS — M76899 Other specified enthesopathies of unspecified lower limb, excluding foot: Secondary | ICD-10-CM

## 2013-07-03 NOTE — Patient Instructions (Signed)
REFERRAL: GO THE THE FRONT ROOM AT THE ENTRANCE OF OUR CLINIC, NEAR CHECK IN. ASK FOR [name]. SHE WILL HELP YOU SET UP YOUR REFERRAL. DATE: TIME:  

## 2013-07-03 NOTE — Progress Notes (Signed)
Date:  07/03/2013   Name:  [name]   DOB:  29-Aug-1951   MRN:  [id number] Gender: female Age: 62 y.o.  Primary Physician:  Tillman Abide, MD   Chief Complaint: Back Pain and Hip Pain   History of Present Illness:  [name] is a 62 y.o. pleasant patient who presents with the following:  Has been ongoing now for about a year or more with right hip, then back, and today, it is her L hip and around in posterior buttocks. On occaision, there will be a tender place on spine. Stays in hip and back. Minimal anterior hip pain. No history of trauma.   At birth, her Right leg was above her head. Felt like she has a lifelong leg length discrepancy.  She does take intermittent motrin and ices without much success.  Patient Active Problem List   Diagnosis Date Noted  . Hx of adenomatous colonic polyps 11/01/2012  . Routine general medical examination at a health care facility 10/09/2011  . Allergic rhinitis due to pollen 10/09/2011  . HYPERLIPIDEMIA 11/06/2009  . OSTEOARTHRITIS 11/06/2009  . Lumbar back pain 04/27/2007    Past Medical History  Diagnosis Date  . Hyperlipidemia   . Osteoarthritis   . Depression   . Gallstones   . Allergy     SEASONAL  . Adenomatous colon polyp     Past Surgical History  Procedure Laterality Date  . Appendectomy    . Cholecystectomy  1990  . Tonsillectomy    . Partial vaginal hysterectomy    . Breast enhancement surgery    . Thumb arthroscopy      bilateral  . Knee arthroscopy      right    History   Social History  . Marital Status: Married    Spouse Name: N/A    Number of Children: 4  . Years of Education: N/A   Occupational History  . housewife    Social History Main Topics  . Smoking status: Former Smoker    Types: Cigarettes  . Smokeless tobacco: Never Used  . Alcohol Use: Yes     Comment: rarely  . Drug Use: No  . Sexual Activity: Not on file   Other Topics Concern  . Not on file   Social  History Narrative  . No narrative on file    Family History  Problem Relation Age of Onset  . Prostate cancer Father   . Diabetes Father   . Heart disease Father   . Hypertension Mother   . Stroke Mother   . Heart attack Paternal Grandfather   . Dementia Paternal Grandmother   . Ulcers Maternal Grandfather     bleeding  . Anuerysm Maternal Grandfather     AAA    Allergies  Allergen Reactions  . Lidocaine     REACTION: hives  . Meloxicam     REACTION: heart palpitations  . Ultram [Tramadol] Other (See Comments)    Pt reports ultram kept her awake all night    Medication list has been reviewed and updated.  Outpatient Prescriptions Prior to Visit  Medication Sig Dispense Refill  . cetirizine (ZYRTEC) 10 MG tablet Take 10 mg by mouth daily.        . fluticasone (FLONASE) 50 MCG/ACT nasal spray USE 2 SPRAYS INTO EACH NOSTRIL ONCE DAILY  16 g  5  . ibuprofen (ADVIL,MOTRIN) 200 MG tablet Take 600 mg by mouth 3 (three) times daily as needed.       Marland Kitchen  loperamide (IMODIUM A-D) 2 MG tablet Take 2 mg by mouth daily as needed.       . Multiple Vitamin (MULTIVITAMIN) tablet Take 1 tablet by mouth daily.       No facility-administered medications prior to visit.    Review of Systems:   GEN: No fevers, chills. Nontoxic. Primarily MSK c/o today. MSK: Detailed in the HPI GI: tolerating PO intake without difficulty Neuro: No numbness, parasthesias, or tingling associated. Otherwise the pertinent positives of the ROS are noted above.    Physical Examination: BP 130/80  Pulse 71  Temp(Src) 99.2 F (37.3 C) (Oral)  Ht 5' 7.5" (1.715 m)  Wt 143 lb 4 oz (64.978 kg)  BMI 22.09 kg/m2  Ideal Body Weight: Weight in (lb) to have BMI = 25: 161.7   GEN: WDWN, NAD, Non-toxic, Alert & Oriented x 3 HEENT: Atraumatic, Normocephalic.  Ears and Nose: No external deformity. EXTR: No clubbing/cyanosis/edema NEURO: Normal gait.  PSYCH: Normally interactive. Conversant. Not depressed or  anxious appearing.  Calm demeanor.   HIP EXAM: SIDE: B ROM: Abduction, Flexion, Internal and External range of motion: full, excellent for age Pain with terminal IROM and EROM: terminal EROM with some posterior pain GTB: mild B SLR: NEG Knees: No effusion FABER: NT REVERSE FABER: NT, neg Piriformis: NT at direct palpation Str: flexion: 5/5 abduction: 4+/5 adduction: 5/5 Strength testing non-tender  Range of motion at  the waist: Flexion: normal Extension: normal Lateral bending: normal Rotation: all normal  No echymosis or edema Rises to examination table with no difficulty  Inspection/Deformity: N Paraspinus Tenderness: minimal  B Ankle Dorsiflexion (L5,4): 5/5 B Great Toe Dorsiflexion (L5,4): 5/5 Heel Walk (L5): WNL Toe Walk (S1): WNL Rise/Squat (L4): WNL  SENSORY B Medial Foot (L4): WNL B Dorsum (L5): WNL B Lateral (S1): WNL Light Touch: WNL Pinprick: WNL  REFLEXES Knee (L4): 2+ Ankle (S1): 2+  B SLR, seated: neg B SLR, supine: neg B Log Roll: neg B Stork: NT B Sciatic Notch: NT   Leg lengths: R leg 1 1/4 cm shorter  On gait analysis, hip height is not level, with modest trendelenburg gait on the R  04/23/2011 CT of the abdomen and pelvis, BONE windows Minimal bilateral hip arthritic changes, joint space is relatively well-preserved. L2-3 level with a moderate amount of DDD, but otherwise with minimal DDD in the L spine and minimal facet arthropathy. Independently reviewed.  [name], MD  Assessment and Plan:  Back pain - Plan: Ambulatory referral to Physical Therapy  Trochanteric bursitis of both hips - Plan: Ambulatory referral to Physical Therapy  Leg length discrepancy, right  >25 minutes spent in face to face time with patient, >50% spent in counselling or coordination of care  B troch bursitis, suspect LL discrepancy may be at play. Placed a leg lift with orthopedic felt on R leg. Formal PT  Recheck 6-8 weeks  Orders Today:    Orders Placed This Encounter  Procedures  . Ambulatory referral to Physical Therapy    Referral Priority:  Routine    Referral Type:  Physical Medicine    Referral Reason:  Specialty Services Required    Requested Specialty:  Physical Therapy    Number of Visits Requested:  1    Updated Medication List: (Includes new medications, updates to list, dose adjustments) No orders of the defined types were placed in this encounter.    Medications Discontinued: There are no discontinued medications.    Signed,  Elpidio Galea. Rhapsody Wolven, MD, CAQ  Sports Medicine  [facility] [address] [place] [address] Phone: [phone] Fax: [phone]

## 2013-07-13 ENCOUNTER — Encounter: Payer: Self-pay | Admitting: Internal Medicine

## 2013-07-13 ENCOUNTER — Ambulatory Visit (INDEPENDENT_AMBULATORY_CARE_PROVIDER_SITE_OTHER): Payer: BC Managed Care – PPO | Admitting: Internal Medicine

## 2013-07-13 VITALS — BP 118/70 | HR 80 | Temp 98.0°F

## 2013-07-13 DIAGNOSIS — H9209 Otalgia, unspecified ear: Secondary | ICD-10-CM

## 2013-07-13 DIAGNOSIS — H9202 Otalgia, left ear: Secondary | ICD-10-CM | POA: Insufficient documentation

## 2013-07-13 MED ORDER — NEOMYCIN-POLYMYXIN-HC 3.5-10000-1 OT SUSP: 4.0000 [drp] | Freq: Four times a day (QID) | OTIC | Status: DC

## 2013-07-13 NOTE — Assessment & Plan Note (Signed)
Probably mild otitis externa No evidence of middle ear disease---despite allergies and chronic drainage Will try cortisporin

## 2013-07-13 NOTE — Progress Notes (Signed)
Subjective:    Patient ID: [name], female    DOB: 1951/05/22, 62 y.o.   MRN: [id number]  HPI Back and hips are much better Got pad in shoe and doing physical therapy  Having left ear pain for 2 days Not sick No cold symptoms  Did have a sharp pain over left maxilla----went away on its own No rhinorrhea. Always has drainage  May have gotten some water in ear--will occasionally wash hair in tub Pain when touching ear  Hasn't tried any Rx  Current Outpatient Prescriptions on File Prior to Visit  Medication Sig Dispense Refill  . cetirizine (ZYRTEC) 10 MG tablet Take 10 mg by mouth daily.        . fluticasone (FLONASE) 50 MCG/ACT nasal spray USE 2 SPRAYS INTO EACH NOSTRIL ONCE DAILY  16 g  5  . ibuprofen (ADVIL,MOTRIN) 200 MG tablet Take 600 mg by mouth 3 (three) times daily as needed.       . loperamide (IMODIUM A-D) 2 MG tablet Take 2 mg by mouth daily as needed.       . Multiple Vitamin (MULTIVITAMIN) tablet Take 1 tablet by mouth daily.       No current facility-administered medications on file prior to visit.    Allergies  Allergen Reactions  . Lidocaine     REACTION: hives  . Meloxicam     REACTION: heart palpitations  . Ultram [Tramadol] Other (See Comments)    Pt reports ultram kept her awake all night    Past Medical History  Diagnosis Date  . Hyperlipidemia   . Osteoarthritis   . Depression   . Gallstones   . Allergy     SEASONAL  . Adenomatous colon polyp     Past Surgical History  Procedure Laterality Date  . Appendectomy    . Cholecystectomy  1990  . Tonsillectomy    . Partial vaginal hysterectomy    . Breast enhancement surgery    . Thumb arthroscopy      bilateral  . Knee arthroscopy      right    Family History  Problem Relation Age of Onset  . Prostate cancer Father   . Diabetes Father   . Heart disease Father   . Hypertension Mother   . Stroke Mother   . Heart attack Paternal Grandfather   . Dementia Paternal Grandmother    . Ulcers Maternal Grandfather     bleeding  . Anuerysm Maternal Grandfather     AAA    History   Social History  . Marital Status: Married    Spouse Name: N/A    Number of Children: 4  . Years of Education: N/A   Occupational History  . housewife    Social History Main Topics  . Smoking status: Former Smoker    Types: Cigarettes  . Smokeless tobacco: Never Used  . Alcohol Use: Yes     Comment: rarely  . Drug Use: No  . Sexual Activity: Not on file   Other Topics Concern  . Not on file   Social History Narrative  . No narrative on file   Review of Systems No fever No stomach trouble     Objective:   Physical Exam  Constitutional: She appears well-developed and well-nourished. No distress.  HENT:  Mouth/Throat: Oropharynx is clear and moist. No oropharyngeal exudate.  Mild left tragal tenderness. TM looks normal. No striking inflammation in canal Right ear normal Moderate pale nasal swelling bilaterally  Neck: Normal  range of motion. Neck supple.  Lymphadenopathy:    She has no cervical adenopathy.          Assessment & Plan:

## 2013-08-14 ENCOUNTER — Encounter: Payer: Self-pay | Admitting: Family Medicine

## 2013-08-14 ENCOUNTER — Ambulatory Visit (INDEPENDENT_AMBULATORY_CARE_PROVIDER_SITE_OTHER): Payer: BC Managed Care – PPO | Admitting: Family Medicine

## 2013-08-14 VITALS — BP 130/80 | HR 83 | Temp 97.8°F | Ht 67.5 in | Wt 145.5 lb

## 2013-08-14 DIAGNOSIS — M549 Dorsalgia, unspecified: Secondary | ICD-10-CM

## 2013-08-14 DIAGNOSIS — M7061 Trochanteric bursitis, right hip: Secondary | ICD-10-CM

## 2013-08-14 DIAGNOSIS — M2170 Unequal limb length (acquired), unspecified site: Secondary | ICD-10-CM

## 2013-08-14 DIAGNOSIS — M76899 Other specified enthesopathies of unspecified lower limb, excluding foot: Secondary | ICD-10-CM

## 2013-08-14 NOTE — Progress Notes (Signed)
Pre-visit discussion using our clinic review tool. No additional management support is needed unless otherwise documented below in the visit note.  

## 2013-08-14 NOTE — Patient Instructions (Signed)
ORTHOPEDIC FELT -- Google or maybe amazon  Excellent Over the Counter Orthotics:  Hapad: available at www.hapad.com (Green Sports Insoles)  SPENCO: Available at some sports stores or GoalForum.com.au. (I prefer the full-length green orthotic that has a yellow bottom / base)  SHOES: Danskos, Merrells, Keens, Clarks, Birkenstocks - good arch support, want minimal bendability. Finn Comfort also excellent, but even more expensive.  Tennis Shoes / Running Shoes: Many good companies and styles. The most important thing is to get a good fit and wear a shoe that feels good in the store. Walk around in the store. Run if that is something that you can do. Some of the running stores have a treadmill, also.  Brooks, New Balance, Andrena Mews are good, but the most important thing is to wear a shoe that fits your foot and feels good.  [employer] in [place]: excellent staff, Armed forces technical officer (Running and [employer]), OTC orthotics. On [address] across from the [employer]. For running shoe and athletic shoe fit, they are the best.  Nicer shoes:  Birkenstock shoes, [employer], [place]: Excellent selection [employer], [address] [place], [place]: They have the largest selection of comfort and supportive shoes that I have ever seen, and their staff is generally quite good in fitting you for shoes. (They will intermittently have sales, mail coupons, and you can look on their website.)

## 2013-08-14 NOTE — Progress Notes (Signed)
Date:  08/14/2013   Name:  [name]   DOB:  1951-02-22   MRN:  [id number] Gender: female Age: 62 y.o.  Primary Physician:  Tillman Abide, MD   Chief Complaint: Follow-up and Foot Orthotics   History of Present Illness:  [name] is a 62 y.o. pleasant patient who presents with the following:  The patient is here in followup, after last office visit, approximately 6 weeks ago we placed a right-sided leg lift and had her do some home rehabilitation and some formal physical therapy. She is dramatically improved today, and she feels much better.  07/03/2013 OV:  Has been ongoing now for about a year or more with right hip, then back, and today, it is her L hip and around in posterior buttocks. On occaision, there will be a tender place on spine. Stays in hip and back. Minimal anterior hip pain. No history of trauma.   At birth, her Right leg was above her head. Felt like she has a lifelong leg length discrepancy.  She does take intermittent motrin and ices without much success.  Patient Active Problem List   Diagnosis Date Noted  . Left ear pain 07/13/2013  . Hx of adenomatous colonic polyps 11/01/2012  . Routine general medical examination at a health care facility 10/09/2011  . Allergic rhinitis due to pollen 10/09/2011  . HYPERLIPIDEMIA 11/06/2009  . OSTEOARTHRITIS 11/06/2009  . Lumbar back pain 04/27/2007    Past Medical History  Diagnosis Date  . Hyperlipidemia   . Osteoarthritis   . Depression   . Gallstones   . Allergy     SEASONAL  . Adenomatous colon polyp     Past Surgical History  Procedure Laterality Date  . Appendectomy    . Cholecystectomy  1990  . Tonsillectomy    . Partial vaginal hysterectomy    . Breast enhancement surgery    . Thumb arthroscopy      bilateral  . Knee arthroscopy      right    History   Social History  . Marital Status: Married    Spouse Name: N/A    Number of Children: 4  . Years of Education: N/A    Occupational History  . housewife    Social History Main Topics  . Smoking status: Former Smoker    Types: Cigarettes  . Smokeless tobacco: Never Used  . Alcohol Use: Yes     Comment: rarely  . Drug Use: No  . Sexual Activity: Not on file   Other Topics Concern  . Not on file   Social History Narrative  . No narrative on file    Family History  Problem Relation Age of Onset  . Prostate cancer Father   . Diabetes Father   . Heart disease Father   . Hypertension Mother   . Stroke Mother   . Heart attack Paternal Grandfather   . Dementia Paternal Grandmother   . Ulcers Maternal Grandfather     bleeding  . Anuerysm Maternal Grandfather     AAA    Allergies  Allergen Reactions  . Lidocaine     REACTION: hives  . Meloxicam     REACTION: heart palpitations  . Ultram [Tramadol] Other (See Comments)    Pt reports ultram kept her awake all night    Medication list has been reviewed and updated.  Outpatient Prescriptions Prior to Visit  Medication Sig Dispense Refill  . cetirizine (ZYRTEC) 10 MG tablet Take 10 mg  by mouth daily.        . fluticasone (FLONASE) 50 MCG/ACT nasal spray USE 2 SPRAYS INTO EACH NOSTRIL ONCE DAILY  16 g  5  . loperamide (IMODIUM A-D) 2 MG tablet Take 2 mg by mouth daily as needed.       . Multiple Vitamin (MULTIVITAMIN) tablet Take 1 tablet by mouth daily.      Marland Kitchen ibuprofen (ADVIL,MOTRIN) 200 MG tablet Take 600 mg by mouth 3 (three) times daily as needed.       . neomycin-polymyxin-hydrocortisone (CORTISPORIN) 3.5-10000-1 otic suspension Place 4 drops into the left ear 4 (four) times daily.  10 mL  1   No facility-administered medications prior to visit.    Review of Systems:   GEN: No fevers, chills. Nontoxic. Primarily MSK c/o today. MSK: Detailed in the HPI GI: tolerating PO intake without difficulty Neuro: No numbness, parasthesias, or tingling associated. Otherwise the pertinent positives of the ROS are noted above.    Physical  Examination: BP 130/80  Pulse 83  Temp(Src) 97.8 F (36.6 C) (Oral)  Ht 5' 7.5" (1.715 m)  Wt 145 lb 8 oz (65.998 kg)  BMI 22.44 kg/m2  Ideal Body Weight: Weight in (lb) to have BMI = 25: 161.7   GEN: WDWN, NAD, Non-toxic, Alert & Oriented x 3 HEENT: Atraumatic, Normocephalic.  Ears and Nose: No external deformity. EXTR: No clubbing/cyanosis/edema NEURO: Normal gait.  PSYCH: Normally interactive. Conversant. Not depressed or anxious appearing.  Calm demeanor.   HIP EXAM: SIDE: B ROM: Abduction, Flexion, Internal and External range of motion: full, excellent for age Pain with terminal IROM and EROM: none GTB: NT SLR: NEG Knees: No effusion FABER: NT REVERSE FABER: NT, neg Piriformis: NT at direct palpation Str: flexion: 5/5 abduction: 5/5 adduction: 5/5 Strength testing non-tender  Leg lengths: R leg 1 1/4 cm shorter  On gait analysis, hip height is not level, with modest trendelenburg gait on the R  04/23/2011 CT of the abdomen and pelvis, BONE windows Minimal bilateral hip arthritic changes, joint space is relatively well-preserved. L2-3 level with a moderate amount of DDD, but otherwise with minimal DDD in the L spine and minimal facet arthropathy. Independently reviewed.  [name], MD  Assessment and Plan:  Back pain  Trochanteric bursitis of both hips  Leg length discrepancy, right  She is much better. Likely a combination of rehabilitation, strengthening, and her right-sided leg lift. Chart he has some orthotics, that one orthopedic office is made for her, and they're in great shape. She can only fit and then one or 2 pairs of shoes. I gave her some orthopedic felt, where I think she can put and lift and some of her smaller shoes.  Medications Discontinued: Medications Discontinued During This Encounter  Medication Reason  . ibuprofen (ADVIL,MOTRIN) 200 MG tablet No longer needed (for PRN medications)  . neomycin-polymyxin-hydrocortisone  (CORTISPORIN) 3.5-10000-1 otic suspension Completed Course      Signed,  Karleen Hampshire T. Zeya Balles, MD, CAQ Sports Medicine  [facility] [address] [place] [address] Phone: [phone] Fax: [phone]

## 2013-10-24 ENCOUNTER — Encounter: Payer: BC Managed Care – PPO | Admitting: Internal Medicine

## 2014-02-02 ENCOUNTER — Encounter: Payer: BC Managed Care – PPO | Admitting: Internal Medicine

## 2014-03-28 ENCOUNTER — Encounter: Payer: Self-pay | Admitting: Internal Medicine

## 2014-03-28 ENCOUNTER — Ambulatory Visit (INDEPENDENT_AMBULATORY_CARE_PROVIDER_SITE_OTHER): Payer: BC Managed Care – PPO | Admitting: Internal Medicine

## 2014-03-28 VITALS — BP 128/80 | HR 72 | Temp 98.6°F | Ht 67.5 in | Wt 142.0 lb

## 2014-03-28 DIAGNOSIS — Z0000 Encounter for general adult medical examination without abnormal findings: Secondary | ICD-10-CM

## 2014-03-28 DIAGNOSIS — E785 Hyperlipidemia, unspecified: Secondary | ICD-10-CM

## 2014-03-28 LAB — LIPID PANEL
Cholesterol: 250 mg/dL — ABNORMAL HIGH (ref 0–200)
HDL: 62.9 mg/dL (ref >39.00)
LDL Cholesterol: 158 mg/dL — ABNORMAL HIGH (ref 0–99)
NONHDL: 187.1
Total CHOL/HDL Ratio: 4
Triglycerides: 146 mg/dL (ref 0.0–149.0)
VLDL: 29.2 mg/dL (ref 0.0–40.0)

## 2014-03-28 LAB — CBC WITH DIFFERENTIAL/PLATELET
Basophils Absolute: 0 10*3/uL (ref 0.0–0.1)
Basophils Relative: 0.7 % (ref 0.0–3.0)
EOS ABS: 0.1 10*3/uL (ref 0.0–0.7)
Eosinophils Relative: 1.7 % (ref 0.0–5.0)
HEMATOCRIT: 42.2 % (ref 36.0–46.0)
Hemoglobin: 14.5 g/dL (ref 12.0–15.0)
LYMPHS ABS: 2 10*3/uL (ref 0.7–4.0)
Lymphocytes Relative: 29 % (ref 12.0–46.0)
MCHC: 34.3 g/dL (ref 30.0–36.0)
MCV: 91.9 fl (ref 78.0–100.0)
MONO ABS: 0.5 10*3/uL (ref 0.1–1.0)
Monocytes Relative: 7.4 % (ref 3.0–12.0)
Neutro Abs: 4.1 10*3/uL (ref 1.4–7.7)
Neutrophils Relative %: 61.2 % (ref 43.0–77.0)
Platelets: 229 10*3/uL (ref 150.0–400.0)
RBC: 4.59 Mil/uL (ref 3.87–5.11)
RDW: 12.6 % (ref 11.5–15.5)
WBC: 6.7 10*3/uL (ref 4.0–10.5)

## 2014-03-28 LAB — COMPREHENSIVE METABOLIC PANEL
ALK PHOS: 70 U/L (ref 39–117)
ALT: 20 U/L (ref 0–35)
AST: 22 U/L (ref 0–37)
Albumin: 4.3 g/dL (ref 3.5–5.2)
BILIRUBIN TOTAL: 0.7 mg/dL (ref 0.2–1.2)
BUN: 16 mg/dL (ref 6–23)
CO2: 28 mEq/L (ref 19–32)
Calcium: 9.5 mg/dL (ref 8.4–10.5)
Chloride: 103 mEq/L (ref 96–112)
Creatinine, Ser: 0.7 mg/dL (ref 0.4–1.2)
GFR: 90.05 mL/min (ref >60.00)
Glucose, Bld: 89 mg/dL (ref 70–99)
Potassium: 4.3 mEq/L (ref 3.5–5.1)
SODIUM: 139 meq/L (ref 135–145)
TOTAL PROTEIN: 7.2 g/dL (ref 6.0–8.3)

## 2014-03-28 LAB — T4, FREE: FREE T4: 0.85 ng/dL (ref 0.60–1.60)

## 2014-03-28 MED ORDER — ZOSTER VACCINE LIVE 19400 UNT/0.65ML ~~LOC~~ SOLR: 0.6500 mL | Freq: Once | SUBCUTANEOUS | Status: DC

## 2014-03-28 NOTE — Progress Notes (Signed)
Subjective:    Patient ID: [name], female    DOB: 1951-02-18, 63 y.o.   MRN: [id number]  HPI Here for physical Back and hip pain is all better now  Had noticed mid left trunk pain--- reduced soy, sugar and carrageenan. This has improved things. If she has had carrageenan--the pain comes back  Has itchy spot on back Not red or inflamed Goes back 2 years Does have some other cysts--had one removed from right arm  Current Outpatient Prescriptions on File Prior to Visit  Medication Sig Dispense Refill  . cetirizine (ZYRTEC) 10 MG tablet Take 10 mg by mouth daily.        . fluticasone (FLONASE) 50 MCG/ACT nasal spray USE 2 SPRAYS INTO EACH NOSTRIL ONCE DAILY  16 g  5  . loperamide (IMODIUM A-D) 2 MG tablet Take 2 mg by mouth daily as needed.       . Multiple Vitamin (MULTIVITAMIN) tablet Take 1 tablet by mouth daily.       No current facility-administered medications on file prior to visit.    Allergies  Allergen Reactions  . Lidocaine     REACTION: hives  . Meloxicam     REACTION: heart palpitations  . Ultram [Tramadol] Other (See Comments)    Pt reports ultram kept her awake all night    Past Medical History  Diagnosis Date  . Hyperlipidemia   . Osteoarthritis   . Depression   . Gallstones   . Allergy     SEASONAL  . Adenomatous colon polyp     Past Surgical History  Procedure Laterality Date  . Appendectomy    . Cholecystectomy  1990  . Tonsillectomy    . Partial vaginal hysterectomy    . Breast enhancement surgery    . Thumb arthroscopy      bilateral  . Knee arthroscopy      right    Family History  Problem Relation Age of Onset  . Prostate cancer Father   . Diabetes Father   . Heart disease Father   . Hypertension Mother   . Stroke Mother   . Heart attack Paternal Grandfather   . Dementia Paternal Grandmother   . Ulcers Maternal Grandfather     bleeding  . Anuerysm Maternal Grandfather     AAA    History   Social History  .  Marital Status: Married    Spouse Name: N/A    Number of Children: 4  . Years of Education: N/A   Occupational History  . housewife    Social History Main Topics  . Smoking status: Former Smoker    Types: Cigarettes  . Smokeless tobacco: Never Used  . Alcohol Use: Yes     Comment: rarely  . Drug Use: No  . Sexual Activity: Not on file   Other Topics Concern  . Not on file   Social History Narrative  . No narrative on file   Review of Systems  Constitutional: Negative for fatigue and unexpected weight change.       Wears seat belt  HENT: Positive for postnasal drip. Negative for dental problem, hearing loss and tinnitus.        Regular with dentist  Eyes: Negative for visual disturbance.       No diplopia or unilateral vision loss Early cataract on right  Respiratory: Positive for cough. Negative for chest tightness and shortness of breath.        Cough due to PND  Cardiovascular: Positive for palpitations. Negative for chest pain and leg swelling.       Feels skips for 30 years (also gets brief spells of going fast) No regular exercise--discussed  Gastrointestinal: Positive for diarrhea. Negative for nausea, vomiting, abdominal pain and blood in stool.       No heartburn Only needs 1/2 of imodium daily now  Endocrine: Positive for heat intolerance. Negative for cold intolerance.       Sweats more lately  Genitourinary: Positive for urgency. Negative for dysuria, hematuria and difficulty urinating.       No incontinence  Musculoskeletal: Negative for arthralgias, back pain and joint swelling.  Skin: Negative for rash.  Allergic/Immunologic: Positive for environmental allergies. Negative for immunocompromised state.  Neurological: Positive for headaches. Negative for dizziness, syncope, weakness, light-headedness and numbness.       Occ exercise induced headaches (like swimming)  Hematological: Negative for adenopathy. Does not bruise/bleed easily.    Psychiatric/Behavioral: Positive for dysphoric mood. Negative for sleep disturbance. The patient is not nervous/anxious.        Rare down days--nothing persistent       Objective:   Physical Exam  Constitutional: She is oriented to person, place, and time. She appears well-developed and well-nourished. No distress.  HENT:  Head: Normocephalic and atraumatic.  Right Ear: External ear normal.  Left Ear: External ear normal.  Mouth/Throat: Oropharynx is clear and moist. No oropharyngeal exudate.  Eyes: Conjunctivae and EOM are normal. Pupils are equal, round, and reactive to light.  Neck: Normal range of motion. Neck supple. No thyromegaly present.  Cardiovascular: Normal rate, regular rhythm, normal heart sounds and intact distal pulses.  Exam reveals no gallop.   No murmur heard. Pulmonary/Chest: Effort normal and breath sounds normal. No respiratory distress. She has no wheezes. She has no rales.  Abdominal: Soft. She exhibits no distension. There is no tenderness. There is no rebound and no guarding.  Genitourinary:  No breast masses. Well circumscribed implant in left breast, less distinct on right  Musculoskeletal: She exhibits no edema and no tenderness.  Lymphadenopathy:    She has no cervical adenopathy.  Neurological: She is alert and oriented to person, place, and time.  Skin: No rash noted. No erythema.  Psychiatric: She has a normal mood and affect. Her behavior is normal.          Assessment & Plan:

## 2014-03-28 NOTE — Progress Notes (Signed)
Pre visit review using our clinic review tool, if applicable. No additional management support is needed unless otherwise documented below in the visit note. 

## 2014-03-28 NOTE — Assessment & Plan Note (Signed)
Discussed primary prevention with statins Fairly low risk profile with total/HDL = 4 She will think about it

## 2014-03-28 NOTE — Assessment & Plan Note (Signed)
Healthy Rx for zostavax Doesn't take flu shots due to egg allergy Mammogram due next year

## 2014-05-07 ENCOUNTER — Other Ambulatory Visit: Payer: Self-pay | Admitting: *Deleted

## 2014-05-07 MED ORDER — FLUTICASONE PROPIONATE 50 MCG/ACT NA SUSP: NASAL | Status: DC

## 2014-05-25 ENCOUNTER — Ambulatory Visit (INDEPENDENT_AMBULATORY_CARE_PROVIDER_SITE_OTHER): Payer: BC Managed Care – PPO | Admitting: Internal Medicine

## 2014-05-25 ENCOUNTER — Encounter: Payer: Self-pay | Admitting: Internal Medicine

## 2014-05-25 VITALS — BP 140/80 | HR 84 | Temp 98.0°F | Wt 144.0 lb

## 2014-05-25 DIAGNOSIS — G43119 Migraine with aura, intractable, without status migrainosus: Secondary | ICD-10-CM

## 2014-05-25 DIAGNOSIS — G43109 Migraine with aura, not intractable, without status migrainosus: Secondary | ICD-10-CM | POA: Insufficient documentation

## 2014-05-25 MED ORDER — SUMATRIPTAN SUCCINATE 50 MG PO TABS
50.0000 mg | ORAL_TABLET | ORAL | Status: DC | PRN
Start: 2014-05-25 — End: 2021-03-20

## 2014-05-25 NOTE — Patient Instructions (Signed)
Please try 1/2 sumatriptan if you get an aura. You can take the other half, and even another whole tablet if the headache persists.

## 2014-05-25 NOTE — Progress Notes (Signed)
Pre visit review using our clinic review tool, if applicable. No additional management support is needed unless otherwise documented below in the visit note. 

## 2014-05-25 NOTE — Assessment & Plan Note (Signed)
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

## 2014-05-25 NOTE — Progress Notes (Signed)
Subjective:    Patient ID: [name], female    DOB: 1951-02-26, 63 y.o.   MRN: [id number]  HPI 4 days ago, developed a shimmery visual aura--lasted 20 minutes Then had left temple throbbing pain and left eye hurt Took advil and it took the edge off --but lasted most of the day Got tightness in her neck later that day--eventually resolved  Then yesterday had a smaller shimmery aura on left side and was quickly followed by left temporal headache Ibuprofen helped again--800mg  Still slightly off today  Optical migraine about 20 years  No sonophobia or photophobia No nausea 1 cup of coffee in AM---no other caffeine  Current Outpatient Prescriptions on File Prior to Visit  Medication Sig Dispense Refill  . cetirizine (ZYRTEC) 10 MG tablet Take 10 mg by mouth daily.        . fluticasone (FLONASE) 50 MCG/ACT nasal spray USE 2 SPRAYS INTO EACH NOSTRIL ONCE DAILY  48 g  1  . loperamide (IMODIUM A-D) 2 MG tablet Take 2 mg by mouth daily as needed.       . Multiple Vitamin (MULTIVITAMIN) tablet Take 1 tablet by mouth daily.       No current facility-administered medications on file prior to visit.    Allergies  Allergen Reactions  . Lidocaine     REACTION: hives  . Meloxicam     REACTION: heart palpitations  . Ultram [Tramadol] Other (See Comments)    Pt reports ultram kept her awake all night    Past Medical History  Diagnosis Date  . Hyperlipidemia   . Osteoarthritis   . Depression   . Gallstones   . Allergy     SEASONAL  . Adenomatous colon polyp     Past Surgical History  Procedure Laterality Date  . Appendectomy    . Cholecystectomy  1990  . Tonsillectomy    . Partial vaginal hysterectomy    . Breast enhancement surgery    . Thumb arthroscopy      bilateral  . Knee arthroscopy      right    Family History  Problem Relation Age of Onset  . Prostate cancer Father   . Diabetes Father   . Heart disease Father   . Hypertension Mother   . Stroke  Mother   . Heart attack Paternal Grandfather   . Dementia Paternal Grandmother   . Ulcers Maternal Grandfather     bleeding  . Anuerysm Maternal Grandfather     AAA    History   Social History  . Marital Status: Married    Spouse Name: N/A    Number of Children: 4  . Years of Education: N/A   Occupational History  . housewife    Social History Main Topics  . Smoking status: Former Smoker    Types: Cigarettes  . Smokeless tobacco: Never Used  . Alcohol Use: Yes     Comment: rarely  . Drug Use: No  . Sexual Activity: Not on file   Other Topics Concern  . Not on file   Social History Narrative  . No narrative on file   Review of Systems No fever Hasn't been sick No weakness, dysphagia, aphasia, facial droop, etc Is prone to sinus pressure (frontal) with weather changes    Objective:   Physical Exam  Constitutional: She is oriented to person, place, and time. She appears well-developed and well-nourished. No distress.  HENT:  Mouth/Throat: Oropharynx is clear and moist. No oropharyngeal exudate.  Eyes: Conjunctivae and EOM are normal. Pupils are equal, round, and reactive to light.  Fundi benign  Neck: Normal range of motion. Neck supple.  Lymphadenopathy:    She has no cervical adenopathy.  Neurological: She is alert and oriented to person, place, and time. She has normal strength. She displays no tremor. No cranial nerve deficit. She exhibits normal muscle tone. She displays a negative Romberg sign. Coordination and gait normal.          Assessment & Plan:

## 2014-09-04 ENCOUNTER — Encounter: Payer: Self-pay | Admitting: Internal Medicine

## 2014-09-04 ENCOUNTER — Ambulatory Visit (INDEPENDENT_AMBULATORY_CARE_PROVIDER_SITE_OTHER): Payer: BC Managed Care – PPO | Admitting: Internal Medicine

## 2014-09-04 VITALS — BP 122/84 | HR 69 | Temp 97.8°F | Wt 145.0 lb

## 2014-09-04 DIAGNOSIS — B3731 Acute candidiasis of vulva and vagina: Secondary | ICD-10-CM

## 2014-09-04 DIAGNOSIS — N390 Urinary tract infection, site not specified: Secondary | ICD-10-CM

## 2014-09-04 DIAGNOSIS — B373 Candidiasis of vulva and vagina: Secondary | ICD-10-CM

## 2014-09-04 DIAGNOSIS — R300 Dysuria: Secondary | ICD-10-CM

## 2014-09-04 MED ORDER — FLUCONAZOLE 150 MG PO TABS: 150.0000 mg | ORAL_TABLET | Freq: Once | ORAL | Status: DC

## 2014-09-04 MED ORDER — CIPROFLOXACIN HCL 500 MG PO TABS: 500.0000 mg | ORAL_TABLET | Freq: Two times a day (BID) | ORAL | Status: DC

## 2014-09-04 NOTE — Patient Instructions (Signed)

## 2014-09-04 NOTE — Progress Notes (Signed)
Pre visit review using our clinic review tool, if applicable. No additional management support is needed unless otherwise documented below in the visit note. 

## 2014-09-04 NOTE — Progress Notes (Signed)
HPI  Pt presents to the clinic today with c/o urgency, frequency and dysuria. She reports this started 2 days ago. She denies fever, chills, nausea or low back pain. She did take some AZO 2 days ago with some relief.  Additionally, she c/o external vaginal itching. She reports this started 1 week ago. She denies vaginal discharge or odor. OTC minconazole cream without relief.   Review of Systems  Past Medical History  Diagnosis Date  . Hyperlipidemia   . Osteoarthritis   . Depression   . Gallstones   . Allergy     SEASONAL  . Adenomatous colon polyp     Family History  Problem Relation Age of Onset  . Prostate cancer Father   . Diabetes Father   . Heart disease Father   . Hypertension Mother   . Stroke Mother   . Heart attack Paternal Grandfather   . Dementia Paternal Grandmother   . Ulcers Maternal Grandfather     bleeding  . Anuerysm Maternal Grandfather     AAA    History   Social History  . Marital Status: Married    Spouse Name: N/A    Number of Children: 4  . Years of Education: N/A   Occupational History  . housewife    Social History Main Topics  . Smoking status: Former Smoker    Types: Cigarettes  . Smokeless tobacco: Never Used  . Alcohol Use: Yes     Comment: rarely  . Drug Use: No  . Sexual Activity: Not on file   Other Topics Concern  . Not on file   Social History Narrative    Allergies  Allergen Reactions  . Lidocaine     REACTION: hives  . Meloxicam     REACTION: heart palpitations  . Ultram [Tramadol] Other (See Comments)    Pt reports ultram kept her awake all night    Constitutional: Denies fever, malaise, fatigue, headache or abrupt weight changes.   GU: Pt reports vaginal itching, urgency, frequency and pain with urination. Denies burning sensation, blood in urine, odor or discharge. Skin: Denies redness, rashes, lesions or ulcercations.   No other specific complaints in a complete review of systems (except as listed in  HPI above).    Objective:   Physical Exam  BP 122/84 mmHg  Pulse 69  Temp(Src) 97.8 F (36.6 C) (Oral)  Wt 145 lb (65.772 kg)  SpO2 99% Wt Readings from Last 3 Encounters:  09/04/14 145 lb (65.772 kg)  05/25/14 144 lb (65.318 kg)  03/28/14 142 lb (64.411 kg)    General: Appears her stated age, well developed, well nourished in NAD. Cardiovascular: Normal rate and rhythm. S1,S2 noted.  No murmur, rubs or gallops noted.  Pulmonary/Chest: Normal effort and positive vesicular breath sounds. No respiratory distress. No wheezes, rales or ronchi noted.  Abdomen: Soft and nontender. Normal bowel sounds, no bruits noted. No distention or masses noted. Liver, spleen and kidneys non palpable.  No CVA tenderness. Pelvic: deferred at this time     Assessment & Plan:   Urgency, Frequency, Dysuria secondary to UTI  Urinalysis: large blood Will send urine culture eRx sent if for Cipro 500 mg BID x 5 days OK to take AZO OTC  Candida Vagnitis:  No wet prep today eRx for Diflucan 150 mg po x 1, may repeat in 3 days if needed Drink plenty of fluids  RTC as needed or if symptoms persist.

## 2014-09-05 LAB — POCT URINALYSIS DIPSTICK
Bilirubin, UA: NEGATIVE
GLUCOSE UA: NEGATIVE
Ketones, UA: NEGATIVE
Leukocytes, UA: NEGATIVE
Nitrite, UA: NEGATIVE
Spec Grav, UA: 1.025
Urobilinogen, UA: NEGATIVE
pH, UA: 6

## 2014-09-05 NOTE — Addendum Note (Signed)
Addended by: Lurlean Nanny on: 09/05/2014 04:23 PM   Modules accepted: Orders

## 2014-09-13 ENCOUNTER — Other Ambulatory Visit: Payer: Self-pay | Admitting: Internal Medicine

## 2014-09-13 ENCOUNTER — Telehealth: Payer: Self-pay

## 2014-09-13 DIAGNOSIS — B3731 Acute candidiasis of vulva and vagina: Secondary | ICD-10-CM

## 2014-09-13 DIAGNOSIS — B373 Candidiasis of vulva and vagina: Secondary | ICD-10-CM

## 2014-09-13 MED ORDER — FLUCONAZOLE 150 MG PO TABS: 150.0000 mg | ORAL_TABLET | Freq: Once | ORAL | Status: DC

## 2014-09-13 NOTE — Telephone Encounter (Signed)
PLEASE NOTE: All timestamps contained within this report are represented as [place] Standard Time. CONFIDENTIALTY NOTICE: This fax transmission is intended only for the addressee. It contains information that is legally privileged, confidential or otherwise protected from use or disclosure. If you are not the intended recipient, you are strictly prohibited from reviewing, disclosing, copying using or disseminating any of this information or taking any action in reliance on or regarding this information. If you have received this fax in error, please notify us immediately by telephone so that we can arrange for its return to [place]. Phone: [phone], Toll-Free: [phone], Fax: [phone] Page: 1 of 2 Call Id: [id number] Jerome Patient Name: [name] Gender: Female DOB: [birth year] Age: 64 Y 36 M 4 D Return Phone Number: [phone] (Primary) Address: City/State/Zip: [place] [address] Client [facility] - Client Client Site [facility] [place], [place] Contact Type Call Call Type Triage / Clinical Relationship To Patient Self Appointment Disposition EMR Appointment Attempted - Not Scheduled Return Phone Number [phone] (Primary) Chief Complaint Vaginal Pain Initial Comment Caller states she is having symptoms of a yeast infection. PreDisposition Call Doctor Nurse Assessment Nurse: Myrle Sheng, RN, Larene Beach Date/Time Eilene Ghazi Time): 09/13/2014 3:20:55 PM Confirm and document reason for call. If symptomatic, describe symptoms. ---Caller states she is having symptoms of a yeast infection. She was in the office last week with bladder and yeast infection, had rx for cipro and diflucan. She has a refill on the diflucan, which she refilled Sunday. Tuesday she started taking sea salt baths BID for the last couple of days. Today she  is itching more. Has the patient traveled out of the country within the last 30 days? ---Not Applicable Does the patient require triage? ---Yes Related visit to physician within the last 2 weeks? ---Yes Does the PT have any chronic conditions? (i.e. diabetes, asthma, etc.) ---No Guidelines Guideline Title Affirmed Question Affirmed Notes Nurse Date/Time Eilene Ghazi Time) Vulvar Symptoms [1] Vulvar itching AND [2] not improved > 3 days following Clare, RN, Larene Beach 09/13/2014 3:22:44 PM Disp. Time Eilene Ghazi Time) Disposition Final User 09/13/2014 3:25:12 PM See PCP When Office is Open (within 3 days) Yes Myrle Sheng, RN, Raye Sorrow Understands: Yes Disagree/Comply: Comply PLEASE NOTE: All timestamps contained within this report are represented as [place] Standard Time. CONFIDENTIALTY NOTICE: This fax transmission is intended only for the addressee. It contains information that is legally privileged, confidential or otherwise protected from use or disclosure. If you are not the intended recipient, you are strictly prohibited from reviewing, disclosing, copying using or disseminating any of this information or taking any action in reliance on or regarding this information. If you have received this fax in error, please notify us immediately by telephone so that we can arrange for its return to [place]. Phone: [phone], Toll-Free: [phone], Fax: [phone] Page: 2 of 2 Call Id: [id number] Care Advice Given Per Guideline SEE PCP WITHIN 3 DAYS: You need to be examined within 2 or 3 days. Call your doctor during regular office hours and make an appointment. (Note: if office will be open tomorrow, tell caller to call then, not in 3 days).). GENITAL HYGIENE: * Keep your genital area clean. Wash daily with water. * Keep your genital area dry. Wear cotton underwear or underwear with a cotton crotch. * Do not douche. * Do not use feminine hygiene products, perfumed soaps. CALL BACK IF: *  Rash or severe itching * Yellow or green vaginal discharge * Foul smelling vaginal discharge * You become worse. CARE ADVICE given per Vulvar Symptoms (Adult) guideline. After Care Instructions Given Call Event Type User Date / Time Description

## 2014-09-13 NOTE — Telephone Encounter (Signed)
Will repeat the diflucan one more time, if no improvement, will need to follow up. RX called into pharmacy

## 2014-09-14 NOTE — Telephone Encounter (Signed)
Pt is aware.  

## 2014-10-08 ENCOUNTER — Encounter: Payer: Self-pay | Admitting: Internal Medicine

## 2015-01-06 HISTORY — PX: CATARACT EXTRACTION W/ INTRAOCULAR LENS  IMPLANT, BILATERAL: SHX1307

## 2015-04-03 ENCOUNTER — Encounter: Payer: BC Managed Care – PPO | Admitting: Internal Medicine

## 2015-04-16 ENCOUNTER — Encounter: Payer: Self-pay | Admitting: Internal Medicine

## 2015-04-16 ENCOUNTER — Ambulatory Visit (INDEPENDENT_AMBULATORY_CARE_PROVIDER_SITE_OTHER): Payer: BLUE CROSS/BLUE SHIELD | Admitting: Internal Medicine

## 2015-04-16 VITALS — BP 130/80 | HR 71 | Temp 97.5°F | Ht 68.0 in | Wt 143.0 lb

## 2015-04-16 DIAGNOSIS — E785 Hyperlipidemia, unspecified: Secondary | ICD-10-CM | POA: Diagnosis not present

## 2015-04-16 DIAGNOSIS — Z0000 Encounter for general adult medical examination without abnormal findings: Secondary | ICD-10-CM

## 2015-04-16 NOTE — Progress Notes (Signed)
Pre visit review using our clinic review tool, if applicable. No additional management support is needed unless otherwise documented below in the visit note. 

## 2015-04-16 NOTE — Progress Notes (Signed)
Subjective:    Patient ID: [name], female    DOB: 1951-05-24, 64 y.o.   MRN: [id number]  HPI Here for physical  Will have occasional spells of feeling "woozy" May occur even sitting Comes and goes quickly in seconds Not related to eating, exercise, head movement, etc Does keep fluid in ears so may be related to that  Migraines have not come back  Had cataracts removed Trouble getting off steroid drops on right eye Seeing Dr Lucita Ferrara  Current Outpatient Prescriptions on File Prior to Visit  Medication Sig Dispense Refill  . cetirizine (ZYRTEC) 10 MG tablet Take 10 mg by mouth daily.      . fluticasone (FLONASE) 50 MCG/ACT nasal spray USE 2 SPRAYS INTO EACH NOSTRIL ONCE DAILY 48 g 1  . loperamide (IMODIUM A-D) 2 MG tablet Take 1 mg by mouth daily as needed.     . Multiple Vitamins-Minerals (ONE-A-DAY WOMENS 50 PLUS PO) Take 1 capsule by mouth daily.    . SUMAtriptan (IMITREX) 50 MG tablet Take 1-2 tablets (50-100 mg total) by mouth every 2 (two) hours as needed for migraine. May repeat in 2 hours if headache persists or recurs. 10 tablet 2   No current facility-administered medications on file prior to visit.    Allergies  Allergen Reactions  . Lidocaine     REACTION: hives  . Meloxicam     REACTION: heart palpitations  . Ultram [Tramadol] Other (See Comments)    Pt reports ultram kept her awake all night    Past Medical History  Diagnosis Date  . Hyperlipidemia   . Osteoarthritis   . Depression   . Gallstones   . Allergy     SEASONAL  . Adenomatous colon polyp     Past Surgical History  Procedure Laterality Date  . Appendectomy    . Cholecystectomy  1990  . Tonsillectomy    . Partial vaginal hysterectomy    . Breast enhancement surgery    . Thumb arthroscopy      bilateral  . Knee arthroscopy      right  . Cataract extraction w/ intraocular lens  implant, bilateral  5/16    Family History  Problem Relation Age of Onset  . Prostate  cancer Father   . Diabetes Father   . Heart disease Father   . Hypertension Mother   . Stroke Mother   . Heart attack Paternal Grandfather   . Dementia Paternal Grandmother   . Ulcers Maternal Grandfather     bleeding  . Anuerysm Maternal Grandfather     AAA    History   Social History  . Marital Status: Married    Spouse Name: N/A  . Number of Children: 4  . Years of Education: N/A   Occupational History  . housewife    Social History Main Topics  . Smoking status: Former Smoker    Types: Cigarettes  . Smokeless tobacco: Never Used  . Alcohol Use: Yes     Comment: rarely  . Drug Use: No  . Sexual Activity: Not on file   Other Topics Concern  . Not on file   Social History Narrative   Review of Systems  Constitutional: Negative for fatigue and unexpected weight change.       Exercises regularly Wears seat belt  HENT: Negative for dental problem, hearing loss and tinnitus.        Keeps up with the dentist  Eyes: Negative for visual disturbance.  Respiratory: Positive  for cough. Negative for chest tightness and shortness of breath.        AM cough from drainage  Cardiovascular: Positive for palpitations. Negative for chest pain and leg swelling.  Gastrointestinal: Negative for nausea, vomiting, abdominal pain, constipation and blood in stool.       Occ heartburn--- tums helps (twice a month)  Endocrine: Negative for polydipsia and polyuria.  Genitourinary: Negative for dysuria, hematuria, difficulty urinating and dyspareunia.  Musculoskeletal: Positive for myalgias and arthralgias. Negative for back pain and joint swelling.       Gets hip bursitis if wears flats too long  Skin: Negative for rash.       Dry skin  Allergic/Immunologic: Positive for environmental allergies. Negative for immunocompromised state.  Neurological: Negative for syncope, weakness and numbness.  Hematological: Negative for adenopathy. Does not bruise/bleed easily.    Psychiatric/Behavioral: Negative for sleep disturbance. The patient is not nervous/anxious.        Some stress [employer]--- husband wanted place in [place] and she didn't (but did buy something). Will be renting that out also       Objective:   Physical Exam  Constitutional: She is oriented to person, place, and time. She appears well-developed and well-nourished. No distress.  HENT:  Head: Normocephalic and atraumatic.  Right Ear: External ear normal.  Left Ear: External ear normal.  Mouth/Throat: Oropharynx is clear and moist. No oropharyngeal exudate.  Eyes: Conjunctivae and EOM are normal. Pupils are equal, round, and reactive to light.  Neck: Normal range of motion. Neck supple. No thyromegaly present.  Cardiovascular: Normal rate, regular rhythm, normal heart sounds and intact distal pulses.  Exam reveals no gallop.   No murmur heard. Pulmonary/Chest: Effort normal and breath sounds normal. No respiratory distress. She has no wheezes. She has no rales.  Abdominal: Soft. There is no tenderness.  Musculoskeletal: She exhibits no edema or tenderness.  Lymphadenopathy:    She has no cervical adenopathy.  Neurological: She is alert and oriented to person, place, and time.  Skin: No rash noted. No erythema.  Psychiatric: She has a normal mood and affect. Her behavior is normal.          Assessment & Plan:

## 2015-04-16 NOTE — Assessment & Plan Note (Signed)
Healthy Mammogram due 2/18 Colonoscopy due next year due to polyps  Healthy lifestyle Doesn't take flu vaccines--has had reaction

## 2015-04-16 NOTE — Assessment & Plan Note (Signed)
Moderate elevation with protective HDL She prefers no statin for primary prevention

## 2015-06-04 ENCOUNTER — Other Ambulatory Visit: Payer: Self-pay | Admitting: Internal Medicine

## 2015-07-05 ENCOUNTER — Encounter (INDEPENDENT_AMBULATORY_CARE_PROVIDER_SITE_OTHER): Payer: BLUE CROSS/BLUE SHIELD | Admitting: Ophthalmology

## 2015-07-05 DIAGNOSIS — H43813 Vitreous degeneration, bilateral: Secondary | ICD-10-CM | POA: Diagnosis not present

## 2015-07-05 DIAGNOSIS — H59031 Cystoid macular edema following cataract surgery, right eye: Secondary | ICD-10-CM

## 2015-07-05 DIAGNOSIS — H353121 Nonexudative age-related macular degeneration, left eye, early dry stage: Secondary | ICD-10-CM

## 2015-07-05 DIAGNOSIS — H33302 Unspecified retinal break, left eye: Secondary | ICD-10-CM | POA: Diagnosis not present

## 2015-07-19 ENCOUNTER — Ambulatory Visit (INDEPENDENT_AMBULATORY_CARE_PROVIDER_SITE_OTHER): Payer: BLUE CROSS/BLUE SHIELD | Admitting: Ophthalmology

## 2015-07-19 DIAGNOSIS — H59031 Cystoid macular edema following cataract surgery, right eye: Secondary | ICD-10-CM | POA: Diagnosis not present

## 2015-07-19 DIAGNOSIS — H33302 Unspecified retinal break, left eye: Secondary | ICD-10-CM

## 2015-08-09 ENCOUNTER — Telehealth: Payer: Self-pay | Admitting: Internal Medicine

## 2015-08-09 NOTE — Telephone Encounter (Signed)
error 

## 2015-08-19 ENCOUNTER — Encounter (INDEPENDENT_AMBULATORY_CARE_PROVIDER_SITE_OTHER): Payer: BLUE CROSS/BLUE SHIELD | Admitting: Ophthalmology

## 2015-08-19 DIAGNOSIS — H59031 Cystoid macular edema following cataract surgery, right eye: Secondary | ICD-10-CM

## 2015-08-19 DIAGNOSIS — D3132 Benign neoplasm of left choroid: Secondary | ICD-10-CM | POA: Diagnosis not present

## 2015-08-19 DIAGNOSIS — H33302 Unspecified retinal break, left eye: Secondary | ICD-10-CM | POA: Diagnosis not present

## 2015-08-19 DIAGNOSIS — H43813 Vitreous degeneration, bilateral: Secondary | ICD-10-CM | POA: Diagnosis not present

## 2015-10-14 ENCOUNTER — Encounter: Payer: Self-pay | Admitting: Internal Medicine

## 2015-12-23 ENCOUNTER — Ambulatory Visit (INDEPENDENT_AMBULATORY_CARE_PROVIDER_SITE_OTHER): Payer: BLUE CROSS/BLUE SHIELD | Admitting: Ophthalmology

## 2015-12-23 DIAGNOSIS — H33302 Unspecified retinal break, left eye: Secondary | ICD-10-CM

## 2015-12-23 DIAGNOSIS — D3132 Benign neoplasm of left choroid: Secondary | ICD-10-CM

## 2015-12-23 DIAGNOSIS — H43813 Vitreous degeneration, bilateral: Secondary | ICD-10-CM

## 2016-04-10 ENCOUNTER — Encounter: Payer: Self-pay | Admitting: Internal Medicine

## 2016-04-21 ENCOUNTER — Encounter: Payer: BLUE CROSS/BLUE SHIELD | Admitting: Internal Medicine

## 2016-08-07 ENCOUNTER — Ambulatory Visit: Admit: 2016-08-07 | Payer: BLUE CROSS/BLUE SHIELD | Admitting: Unknown Physician Specialty

## 2016-08-07 SURGERY — COLONOSCOPY WITH PROPOFOL
Anesthesia: General

## 2020-01-15 ENCOUNTER — Other Ambulatory Visit: Payer: Self-pay | Admitting: Unknown Physician Specialty

## 2020-01-15 DIAGNOSIS — H9312 Tinnitus, left ear: Secondary | ICD-10-CM

## 2020-01-23 ENCOUNTER — Ambulatory Visit
Admission: RE | Admit: 2020-01-23 | Discharge: 2020-01-23 | Disposition: A | Discharge status: Home / Self Care | Payer: Medicare Other | Source: Ambulatory Visit | Attending: Unknown Physician Specialty | Admitting: Unknown Physician Specialty

## 2020-01-23 ENCOUNTER — Other Ambulatory Visit: Payer: Self-pay

## 2020-01-23 DIAGNOSIS — H9312 Tinnitus, left ear: Secondary | ICD-10-CM | POA: Diagnosis not present

## 2020-10-19 ENCOUNTER — Encounter: Payer: Self-pay | Admitting: Emergency Medicine

## 2020-10-19 ENCOUNTER — Other Ambulatory Visit: Payer: Self-pay

## 2020-10-19 ENCOUNTER — Emergency Department: Payer: Medicare Other

## 2020-10-19 ENCOUNTER — Emergency Department
Admission: EM | Admit: 2020-10-19 | Discharge: 2020-10-19 | Disposition: A | Discharge status: Home / Self Care | Payer: Medicare Other | Attending: Emergency Medicine | Admitting: Emergency Medicine

## 2020-10-19 DIAGNOSIS — M549 Dorsalgia, unspecified: Secondary | ICD-10-CM | POA: Insufficient documentation

## 2020-10-19 DIAGNOSIS — I4891 Unspecified atrial fibrillation: Secondary | ICD-10-CM

## 2020-10-19 DIAGNOSIS — E876 Hypokalemia: Secondary | ICD-10-CM | POA: Diagnosis not present

## 2020-10-19 DIAGNOSIS — Z87891 Personal history of nicotine dependence: Secondary | ICD-10-CM | POA: Diagnosis not present

## 2020-10-19 DIAGNOSIS — R002 Palpitations: Secondary | ICD-10-CM | POA: Diagnosis present

## 2020-10-19 LAB — BASIC METABOLIC PANEL
Anion gap: 9 (ref 5–15)
BUN: 23 mg/dL (ref 8–23)
CO2: 25 mmol/L (ref 22–32)
Calcium: 9.3 mg/dL (ref 8.9–10.3)
Chloride: 106 mmol/L (ref 98–111)
Creatinine, Ser: 0.72 mg/dL (ref 0.44–1.00)
GFR, Estimated: >60 mL/min (ref >60)
Glucose, Bld: 162 mg/dL — ABNORMAL HIGH (ref 70–99)
Potassium: 3.4 mmol/L — ABNORMAL LOW (ref 3.5–5.1)
Sodium: 140 mmol/L (ref 135–145)

## 2020-10-19 LAB — PROTIME-INR
INR: 1.2 (ref 0.8–1.2)
Prothrombin Time: 14.3 seconds (ref 11.4–15.2)

## 2020-10-19 LAB — CBC
HCT: 41 % (ref 36.0–46.0)
Hemoglobin: 14.4 g/dL (ref 12.0–15.0)
MCH: 31.4 pg (ref 26.0–34.0)
MCHC: 35.1 g/dL (ref 30.0–36.0)
MCV: 89.5 fL (ref 80.0–100.0)
Platelets: 209 10*3/uL (ref 150–400)
RBC: 4.58 MIL/uL (ref 3.87–5.11)
RDW: 12.2 % (ref 11.5–15.5)
WBC: 10.8 10*3/uL — ABNORMAL HIGH (ref 4.0–10.5)
nRBC: 0 % (ref 0.0–0.2)

## 2020-10-19 LAB — MAGNESIUM: Magnesium: 2.1 mg/dL (ref 1.7–2.4)

## 2020-10-19 LAB — TROPONIN I (HIGH SENSITIVITY)
Troponin I (High Sensitivity): 25 ng/L — ABNORMAL HIGH (ref <18)
Troponin I (High Sensitivity): 35 ng/L — ABNORMAL HIGH (ref <18)

## 2020-10-19 MED ORDER — POTASSIUM CHLORIDE CRYS ER 20 MEQ PO TBCR
40.0000 meq | EXTENDED_RELEASE_TABLET | Freq: Once | ORAL | Status: AC
  Administered 2020-10-19: 40 meq via ORAL
  Filled 2020-10-19: qty 2

## 2020-10-19 MED ORDER — METOPROLOL TARTRATE 25 MG PO TABS
12.5000 mg | ORAL_TABLET | Freq: Once | ORAL | Status: AC
  Administered 2020-10-19: 12.5 mg via ORAL
  Filled 2020-10-19: qty 1

## 2020-10-19 MED ORDER — POTASSIUM CHLORIDE 10 MEQ/100ML IV SOLN
10.0000 meq | Freq: Once | INTRAVENOUS | Status: AC
  Administered 2020-10-19: 10 meq via INTRAVENOUS
  Filled 2020-10-19: qty 100

## 2020-10-19 MED ORDER — METOPROLOL TARTRATE 25 MG PO TABS: 12.5000 mg | ORAL_TABLET | Freq: Two times a day (BID) | ORAL | 0 refills | Status: DC

## 2020-10-19 MED ORDER — DILTIAZEM HCL 25 MG/5ML IV SOLN
INTRAVENOUS | Status: AC
  Filled 2020-10-19: qty 5

## 2020-10-19 NOTE — ED Triage Notes (Signed)
Patient ambulator to triage with complaints of "afib acting up" pt c/o "jumpy and beating fast heart", started at 1645 "pounding" - pt reports off afib meds in June of 2020  Pt c/o of lightheadedness while walking but goes away when sitting  Pt took IBU 800 at 0800 today.  Speaking in complete coherent sentences. No acute breathing distress noted.

## 2020-10-19 NOTE — ED Provider Notes (Addendum)
[facility] Emergency Department Provider Note  ____________________________________________   Event Date/Time   First MD Initiated Contact with Patient 10/19/20 2058     (approximate)  I have reviewed the triage vital signs and the nursing notes.   HISTORY  Chief Complaint Palpitations    HPI [name] is a 70 y.o. female with hyperlipidemia, gallstones, history of fast heart rates who comes in with A. fib with RVR.  Patient reports having palpitations that started a few hours ago.  She states that she had a little bit of chest pain and back pain with it.  She states that she waited 3 hours to see if it would resolve on its own but did not stop.  She reports coming in today because the palpitations were severe, constant, nothing made it better, nothing made it worse.  She reports having a history of elevated heart rate that they thought might have been A. fib but they were not sure.  She was on metoprolol for many years but then discontinued a few years ago because she never had any repeat episodes of A. fib.  She is not on any blood thinners.  Upon my arrival to the room patient had already converted to sinus.  She reports feeling back to her baseline self          Past Medical History:  Diagnosis Date  . Adenomatous colon polyp   . Allergy    SEASONAL  . Depression   . Gallstones   . Hyperlipidemia   . Osteoarthritis     Patient Active Problem List   Diagnosis Date Noted  . Migraine with aura 05/25/2014  . Hx of adenomatous colonic polyps 11/01/2012  . Routine general medical examination at a health care facility 10/09/2011  . Allergic rhinitis due to pollen 10/09/2011  . Hyperlipemia 11/06/2009  . OSTEOARTHRITIS 11/06/2009    Past Surgical History:  Procedure Laterality Date  . APPENDECTOMY    . BREAST ENHANCEMENT SURGERY    . CATARACT EXTRACTION W/ INTRAOCULAR LENS  IMPLANT, BILATERAL  5/16  . CHOLECYSTECTOMY  1990  . KNEE  ARTHROSCOPY     right  . partial vaginal hysterectomy    . THUMB ARTHROSCOPY     bilateral  . TONSILLECTOMY      Prior to Admission medications   Medication Sig Start Date End Date Taking? Authorizing Provider  Calcium-Phosphorus-Vitamin D (CITRACAL CALCIUM GUMMIES PO) Take by mouth daily.    [provider]  cetirizine (ZYRTEC) 10 MG tablet Take 10 mg by mouth daily.      [provider]  fluticasone (FLONASE) 50 MCG/ACT nasal spray USE 2 SPRAYS INTO EACH NOSTRIL ONCE DAILY 06/04/15   Viviana Simpler I, MD  loperamide (IMODIUM A-D) 2 MG tablet Take 1 mg by mouth daily as needed.     [provider]  Multiple Vitamins-Minerals (ONE-A-DAY WOMENS 50 PLUS PO) Take 1 capsule by mouth daily.    [provider]  SUMAtriptan (IMITREX) 50 MG tablet Take 1-2 tablets (50-100 mg total) by mouth every 2 (two) hours as needed for migraine. May repeat in 2 hours if headache persists or recurs. 05/25/14   Venia Carbon, MD    Allergies Lidocaine, Meloxicam, Other, and Ultram [tramadol]  Family History  Problem Relation Age of Onset  . Prostate cancer Father   . Diabetes Father   . Heart disease Father   . Hypertension Mother   . Stroke Mother   . Heart attack  Paternal Grandfather   . Dementia Paternal Grandmother   . Ulcers Maternal Grandfather        bleeding  . Anuerysm Maternal Grandfather        AAA    Social History Social History   Tobacco Use  . Smoking status: Former Smoker    Types: Cigarettes  . Smokeless tobacco: Never Used  Substance Use Topics  . Alcohol use: Yes    Comment: rarely  . Drug use: No      Review of Systems Constitutional: No fever/chills Eyes: No visual changes. ENT: No sore throat. Cardiovascular: Positive chest pain, palpitation Respiratory: Denies shortness of breath. Gastrointestinal: No abdominal pain.  No nausea, no vomiting.  No diarrhea.  No constipation. Genitourinary: Negative for  dysuria. Musculoskeletal: Positive mild back pain Skin: Negative for rash. Neurological: Negative for headaches, focal weakness or numbness. All other ROS negative ____________________________________________   PHYSICAL EXAM:  VITAL SIGNS: ED Triage Vitals  Enc Vitals Group     BP 10/19/20 2034 (!) 172/100     Pulse Rate 10/19/20 2034 (!) 144     Resp 10/19/20 2034 20     Temp 10/19/20 2034 (!) 97.5 F (36.4 C)     Temp Source 10/19/20 2034 Oral     SpO2 10/19/20 2034 97 %     Weight 10/19/20 2035 134 lb (60.8 kg)     Height 10/19/20 2035 5\' 7"  (1.702 m)     Head Circumference --      Peak Flow --      Pain Score 10/19/20 2035 0     Pain Loc --      Pain Edu? --      Excl. in [place]? --     Constitutional: Alert and oriented. Well appearing and in no acute distress. Eyes: Conjunctivae are normal. EOMI. Head: Atraumatic. Nose: No congestion/rhinnorhea. Mouth/Throat: Mucous membranes are moist.   Neck: No stridor. Trachea Midline. FROM Cardiovascular: Normal rate, regular rhythm. Grossly normal heart sounds.  Good peripheral circulation. Respiratory: Normal respiratory effort.  No retractions. Lungs CTAB. Gastrointestinal: Soft and nontender. No distention. No abdominal bruits.  Musculoskeletal: No lower extremity tenderness nor edema.  No joint effusions. Neurologic:  Normal speech and language. No gross focal neurologic deficits are appreciated.  Skin:  Skin is warm, dry and intact. No rash noted. Psychiatric: Mood and affect are normal. Speech and behavior are normal. GU: Deferred   ____________________________________________   LABS (all labs ordered are listed, but only abnormal results are displayed)  Labs Reviewed  BASIC METABOLIC PANEL - Abnormal; Notable for the following components:      Result Value   Potassium 3.4 (*)    Glucose, Bld 162 (*)    All other components within normal limits  CBC - Abnormal; Notable for the following components:   WBC 10.8 (*)     All other components within normal limits  TROPONIN I (HIGH SENSITIVITY) - Abnormal; Notable for the following components:   Troponin I (High Sensitivity) 25 (*)    All other components within normal limits  PROTIME-INR  MAGNESIUM  TROPONIN I (HIGH SENSITIVITY)   ____________________________________________   ED ECG REPORT I, Vanessa , the attending physician, personally viewed and interpreted this ECG.  EKG is A. fib with RVR with no ST elevation or T wave inversions, normal intervals  Repeat EKG sinus rate of 87, no ST elevation, no T wave inversion, normal intervals ____________________________________________  RADIOLOGY Robert Bellow, personally viewed and evaluated  these images (plain radiographs) as part of my medical decision making, as well as reviewing the written report by the radiologist.  ED MD interpretation: No pneumonia  Official radiology report(s): DG Chest 2 View  Result Date: 10/19/2020 CLINICAL DATA:  Palpitations. Patient reports atrial fibrillation is acting up. Lightheadedness. EXAM: CHEST - 2 VIEW COMPARISON:  11/01/2012 FINDINGS: Cardiac silhouette is normal in size and configuration. No mediastinal or hilar masses or evidence of adenopathy. Lungs are hyperexpanded but clear. No pleural effusion or pneumothorax. Skeletal structures are intact. IMPRESSION: No active cardiopulmonary disease. Electronically Signed   By: Lajean Manes M.D.   On: 10/19/2020 21:21    ____________________________________________   PROCEDURES  Procedure(s) performed (including Critical Care):  .1-3 Lead EKG Interpretation Performed by: Vanessa [place], MD Authorized by: Vanessa [place], MD     Interpretation: normal     ECG rate:  90s   ECG rate assessment: normal     Rhythm: sinus rhythm     Ectopy: none     Conduction: normal   Comments:     Patient initially was A. fib with RVR on EKG but when I arrived in the room she on the monitor was now sinus tachycardia  which later went down to normal sinus rhythm.     ____________________________________________   INITIAL IMPRESSION / ASSESSMENT AND PLAN / ED COURSE  [name] was evaluated in Emergency Department on 10/19/2020 for the symptoms described in the history of present illness. She was evaluated in the context of the global COVID-19 pandemic, which necessitated consideration that the patient might be at risk for infection with the SARS-CoV-2 virus that causes COVID-19. Institutional protocols and algorithms that pertain to the evaluation of patients at risk for COVID-19 are in a state of rapid change based on information released by regulatory bodies including the CDC and federal and state organizations. These policies and algorithms were followed during the patient's care in the ED.    Patient is 70 year old with history of potential A. fib taken off metoprolol who now comes in with A. fib with RVR confirmed on EKG.  Will get labs evaluate for electrolyte abnormalities.  Doubt that this is thyroid related given she has had this previously and resolved on its own.  She does report a little chest pains will get cardiac marker but suspect it was slightly elevated secondary to the demand.  Will get chest x-ray to evaluate for pneumonia, pneumothorax.Marland Kitchen  Her back pain is now since resolved so low suspicion for dissection.  Upon coming into the room patient had already converted to sinus without any interventions.  We will give a dose of oral metoprolol  Chest x-ray negative  Potassium is low and I repleted this.  Her magnesium was normal  Had a lengthy discussion with patient about her [place] Vascor of 2 and recommendation of anticoagulation due to her risk of stroke.  This time patient would not like to be started on anticoagulation.  I encouraged her to at least take a baby aspirin and to follow-up with a cardiologist.  Patient is followed with [facility].  We will start her low-dose  metoprolol.  Discussed with Dr. Ubaldo Glassing.  Troponin was elevated but most likely in the setting demand.  Okay to discharge home with follow-up with cardiology  Repeat  evaluation patient's heart rate continues to be normal.  Will discharge patient  ____________________________________________   FINAL CLINICAL IMPRESSION(S) / ED DIAGNOSES   Final diagnoses:  Atrial fibrillation with  RVR ([place])  Hypokalemia      MEDICATIONS GIVEN DURING THIS VISIT:  Medications  potassium chloride 10 mEq in 100 mL IVPB (10 mEq Intravenous New Bag/Given 10/19/20 2153)  potassium chloride SA (KLOR-CON) CR tablet 40 mEq (40 mEq Oral Given 10/19/20 2153)  metoprolol tartrate (LOPRESSOR) tablet 12.5 mg (12.5 mg Oral Given 10/19/20 2153)     ED Discharge Orders         Ordered    metoprolol tartrate (LOPRESSOR) 25 MG tablet  2 times daily        10/19/20 2240           Note:  This document was prepared using Dragon voice recognition software and may include unintentional dictation errors.   Vanessa [place], MD 10/19/20 2241    Vanessa [place], MD 10/19/20 [phone]

## 2020-10-19 NOTE — Discharge Instructions (Addendum)
You had A. fib with RVR captured on EKG but were able to convert to normal sinus without any interventions.  I have started you on a low-dose metoprolol to try to prevent you from going back into this.  We discussed anticoagulation you like to hold off at this time.  I recommend a baby aspirin at least every should have further discussions with your cardiologist about full anticoagulation.  Your cardiac markers were slightly elevated in the setting of your A. fib.  Discussed with your cardiologist about needing a stress test.  Return to the ER if you develop recurrent symptoms or any other concern

## 2021-03-11 ENCOUNTER — Other Ambulatory Visit: Payer: Self-pay | Admitting: Surgery

## 2021-03-26 ENCOUNTER — Other Ambulatory Visit: Payer: Self-pay

## 2021-03-26 ENCOUNTER — Other Ambulatory Visit
Admission: RE | Admit: 2021-03-26 | Discharge: 2021-03-26 | Disposition: A | Discharge status: Home / Self Care | Payer: Medicare Other | Source: Ambulatory Visit | Attending: Surgery | Admitting: Surgery

## 2021-03-26 DIAGNOSIS — Z01818 Encounter for other preprocedural examination: Secondary | ICD-10-CM | POA: Diagnosis present

## 2021-03-26 HISTORY — DX: Other complications of anesthesia, initial encounter: T88.59XA

## 2021-03-26 HISTORY — DX: Cardiac arrhythmia, unspecified: I49.9

## 2021-03-26 LAB — SURGICAL PCR SCREEN
MRSA, PCR: NEGATIVE
Staphylococcus aureus: POSITIVE — AB

## 2021-03-26 NOTE — Patient Instructions (Addendum)
Your procedure is scheduled on: 04/02/21 - Wednesday Report to the Registration Desk on the 1st floor of the [facility]. To find out your arrival time, please call [phone] between 1PM - 3PM on: 04/01/21 - Tuesday Report to [facility] for EKG 03/26/21 at 2:30 pm.  REMEMBER: Instructions that are not followed completely may result in serious medical risk, up to and including death; or upon the discretion of your surgeon and anesthesiologist your surgery may need to be rescheduled.  Do not eat food after midnight the night before surgery.  No gum chewing, lozengers or hard candies.  You may however, drink CLEAR liquids up to 2 hours before you are scheduled to arrive for your surgery. Do not drink anything within 2 hours of your scheduled arrival time.  Clear liquids include: - water  - apple juice without pulp - gatorade (not RED, PURPLE, OR BLUE) - black coffee or tea (Do NOT add milk or creamers to the coffee or tea) Do NOT drink anything that is not on this list.  Type 1 and Type 2 diabetics should only drink water.  In addition, your doctor has ordered for you to drink the provided  Ensure Pre-Surgery Clear Carbohydrate Drink  Drinking this carbohydrate drink up to two hours before surgery helps to reduce insulin resistance and improve patient outcomes. Please complete drinking 2 hours prior to scheduled arrival time.  TAKE THESE MEDICATIONS THE MORNING OF SURGERY WITH A SIP OF WATER:  - metoprolol tartrate (LOPRESSOR) 25 MG tablet  Follow recommendations from Cardiologist, Pulmonologist or PCP regarding stopping Aspirin, Coumadin, Plavix, Eliquis, Pradaxa, or Pletal. Stop taking Eliquis 5 mg beginning 03/29/21, may resume after surgery.  One week prior to surgery: Stop Anti-inflammatories (NSAIDS) such as Advil, Aleve, Ibuprofen, Motrin, Naproxen, Naprosyn and Aspirin based products such as Excedrin, Goodys Powder, BC Powder.  Stop ANY OVER THE COUNTER supplements  until after surgery.  You may however, continue to take Tylenol if needed for pain up until the day of surgery.  No Alcohol for 24 hours before or after surgery.  No Smoking including e-cigarettes for 24 hours prior to surgery.  No chewable tobacco products for at least 6 hours prior to surgery.  No nicotine patches on the day of surgery.  Do not use any "recreational" drugs for at least a week prior to your surgery.  Please be advised that the combination of cocaine and anesthesia may have negative outcomes, up to and including death. If you test positive for cocaine, your surgery will be cancelled.  On the morning of surgery brush your teeth with toothpaste and water, you may rinse your mouth with mouthwash if you wish. Do not swallow any toothpaste or mouthwash.  Do not wear jewelry, make-up, hairpins, clips or nail polish.  Do not wear lotions, powders, or perfumes.   Do not shave body from the neck down 48 hours prior to surgery just in case you cut yourself which could leave a site for infection.  Also, freshly shaved skin may become irritated if using the CHG soap.  Contact lenses, hearing aids and dentures may not be worn into surgery.  Do not bring valuables to the hospital. [facility] is not responsible for any missing/lost belongings or valuables.   Use CHG Soap or wipes as directed on instruction sheet.  Notify your doctor if there is any change in your medical condition (cold, fever, infection).  Wear comfortable clothing (specific to your surgery type) to the hospital.  After  surgery, you can help prevent lung complications by doing breathing exercises.  Take deep breaths and cough every 1-2 hours. Your doctor may order a device called an Incentive Spirometer to help you take deep breaths. When coughing or sneezing, hold a pillow firmly against your incision with both hands. This is called "splinting." Doing this helps protect your incision. It also decreases belly  discomfort.  If you are being admitted to the hospital overnight, leave your suitcase in the car. After surgery it may be brought to your room.  If you are being discharged the day of surgery, you will not be allowed to drive home. You will need a responsible adult (18 years or older) to drive you home and stay with you that night.   If you are taking public transportation, you will need to have a responsible adult (18 years or older) with you. Please confirm with your physician that it is acceptable to use public transportation.   Please call the [facility] at [phone] if you have any questions about these instructions.  Surgery Visitation Policy:  Patients undergoing a surgery or procedure may have one family member or support person with them as long as that person is not COVID-19 positive or experiencing its symptoms.  That person may remain in the waiting area during the procedure.  Inpatient Visitation:    Visiting hours are 7 a.m. to 8 p.m. Inpatients will be allowed two visitors daily. The visitors may change each day during the patient's stay. No visitors under the age of 61. Any visitor under the age of 76 must be accompanied by an adult. The visitor must pass COVID-19 screenings, use hand sanitizer when entering and exiting the patient's room and wear a mask at all times, including in the patient's room. Patients must also wear a mask when staff or their visitor are in the room. Masking is required regardless of vaccination status.

## 2021-03-31 ENCOUNTER — Telehealth: Payer: Self-pay | Admitting: Internal Medicine

## 2021-04-02 ENCOUNTER — Encounter
Admission: RE | Disposition: A | Discharge status: Home / Self Care | Payer: Self-pay | Source: Ambulatory Visit | Attending: Surgery

## 2021-04-02 ENCOUNTER — Ambulatory Visit
Admission: RE | Admit: 2021-04-02 | Discharge: 2021-04-02 | Disposition: A | Discharge status: Home / Self Care | Payer: Medicare Other | Source: Ambulatory Visit | Attending: Surgery | Admitting: Surgery

## 2021-04-02 ENCOUNTER — Other Ambulatory Visit: Payer: Self-pay

## 2021-04-02 ENCOUNTER — Ambulatory Visit: Payer: Medicare Other | Admitting: Anesthesiology

## 2021-04-02 ENCOUNTER — Ambulatory Visit: Payer: Medicare Other

## 2021-04-02 ENCOUNTER — Encounter: Payer: Self-pay | Admitting: Surgery

## 2021-04-02 DIAGNOSIS — Z886 Allergy status to analgesic agent status: Secondary | ICD-10-CM | POA: Diagnosis not present

## 2021-04-02 DIAGNOSIS — Z79899 Other long term (current) drug therapy: Secondary | ICD-10-CM | POA: Diagnosis not present

## 2021-04-02 DIAGNOSIS — Z887 Allergy status to serum and vaccine status: Secondary | ICD-10-CM | POA: Insufficient documentation

## 2021-04-02 DIAGNOSIS — Z8261 Family history of arthritis: Secondary | ICD-10-CM | POA: Diagnosis not present

## 2021-04-02 DIAGNOSIS — Z8349 Family history of other endocrine, nutritional and metabolic diseases: Secondary | ICD-10-CM | POA: Diagnosis not present

## 2021-04-02 DIAGNOSIS — Z8262 Family history of osteoporosis: Secondary | ICD-10-CM | POA: Diagnosis not present

## 2021-04-02 DIAGNOSIS — Z888 Allergy status to other drugs, medicaments and biological substances status: Secondary | ICD-10-CM | POA: Diagnosis not present

## 2021-04-02 DIAGNOSIS — Z833 Family history of diabetes mellitus: Secondary | ICD-10-CM | POA: Insufficient documentation

## 2021-04-02 DIAGNOSIS — Z809 Family history of malignant neoplasm, unspecified: Secondary | ICD-10-CM | POA: Insufficient documentation

## 2021-04-02 DIAGNOSIS — I4891 Unspecified atrial fibrillation: Secondary | ICD-10-CM | POA: Diagnosis not present

## 2021-04-02 DIAGNOSIS — Z7901 Long term (current) use of anticoagulants: Secondary | ICD-10-CM | POA: Insufficient documentation

## 2021-04-02 DIAGNOSIS — Z820 Family history of epilepsy and other diseases of the nervous system: Secondary | ICD-10-CM | POA: Diagnosis not present

## 2021-04-02 DIAGNOSIS — Z884 Allergy status to anesthetic agent status: Secondary | ICD-10-CM | POA: Insufficient documentation

## 2021-04-02 DIAGNOSIS — Z87891 Personal history of nicotine dependence: Secondary | ICD-10-CM | POA: Insufficient documentation

## 2021-04-02 DIAGNOSIS — M1811 Unilateral primary osteoarthritis of first carpometacarpal joint, right hand: Secondary | ICD-10-CM | POA: Diagnosis present

## 2021-04-02 DIAGNOSIS — Z791 Long term (current) use of non-steroidal anti-inflammatories (NSAID): Secondary | ICD-10-CM | POA: Insufficient documentation

## 2021-04-02 DIAGNOSIS — Z8249 Family history of ischemic heart disease and other diseases of the circulatory system: Secondary | ICD-10-CM | POA: Insufficient documentation

## 2021-04-02 DIAGNOSIS — Z885 Allergy status to narcotic agent status: Secondary | ICD-10-CM | POA: Diagnosis not present

## 2021-04-02 HISTORY — PX: CARPOMETACARPAL (CMC) FUSION OF THUMB: SHX6290

## 2021-04-02 SURGERY — CARPOMETACARPAL (CMC) FUSION OF THUMB
Anesthesia: General | Site: Thumb | Laterality: Right

## 2021-04-02 MED ORDER — ONDANSETRON HCL 4 MG/2ML IJ SOLN: 4.0000 mg | Freq: Four times a day (QID) | INTRAMUSCULAR | Status: DC | PRN

## 2021-04-02 MED ORDER — MIDAZOLAM HCL 2 MG/2ML IJ SOLN
INTRAMUSCULAR | Status: AC
  Filled 2021-04-02: qty 2

## 2021-04-02 MED ORDER — ACETAMINOPHEN 500 MG PO TABS
1000.0000 mg | ORAL_TABLET | Freq: Once | ORAL | Status: AC
  Administered 2021-04-02: 1000 mg via ORAL

## 2021-04-02 MED ORDER — GLYCOPYRROLATE 0.2 MG/ML IJ SOLN
INTRAMUSCULAR | Status: DC | PRN
  Administered 2021-04-02: .1 mg via INTRAVENOUS

## 2021-04-02 MED ORDER — LACTATED RINGERS IV SOLN: INTRAVENOUS | Status: DC

## 2021-04-02 MED ORDER — OXYCODONE HCL 5 MG PO TABS
5.0000 mg | ORAL_TABLET | Freq: Once | ORAL | Status: DC
Start: 2021-04-02 — End: 2021-04-02

## 2021-04-02 MED ORDER — ACETAMINOPHEN 500 MG PO TABS
ORAL_TABLET | ORAL | Status: AC
  Filled 2021-04-02: qty 2

## 2021-04-02 MED ORDER — CEFAZOLIN SODIUM-DEXTROSE 2-4 GM/100ML-% IV SOLN
2.0000 g | INTRAVENOUS | Status: AC
  Administered 2021-04-02: 2 g via INTRAVENOUS

## 2021-04-02 MED ORDER — CHLORHEXIDINE GLUCONATE 0.12 % MT SOLN
OROMUCOSAL | Status: AC
  Administered 2021-04-02: 15 mL via OROMUCOSAL
  Filled 2021-04-02: qty 15

## 2021-04-02 MED ORDER — PROPOFOL 10 MG/ML IV BOLUS
INTRAVENOUS | Status: DC | PRN
  Administered 2021-04-02: 150 mg via INTRAVENOUS

## 2021-04-02 MED ORDER — FAMOTIDINE 20 MG PO TABS: 20.0000 mg | ORAL_TABLET | Freq: Once | ORAL | Status: AC

## 2021-04-02 MED ORDER — DEXAMETHASONE SODIUM PHOSPHATE 10 MG/ML IJ SOLN
INTRAMUSCULAR | Status: DC | PRN
  Administered 2021-04-02: 5 mg via INTRAVENOUS

## 2021-04-02 MED ORDER — FENTANYL CITRATE (PF) 100 MCG/2ML IJ SOLN
INTRAMUSCULAR | Status: AC
  Filled 2021-04-02: qty 2

## 2021-04-02 MED ORDER — EPHEDRINE SULFATE 50 MG/ML IJ SOLN
INTRAMUSCULAR | Status: DC | PRN
  Administered 2021-04-02 (×3): 5 mg via INTRAVENOUS
  Administered 2021-04-02 (×2): 2.5 mg via INTRAVENOUS

## 2021-04-02 MED ORDER — BUPIVACAINE HCL (PF) 0.5 % IJ SOLN
INTRAMUSCULAR | Status: DC | PRN
  Administered 2021-04-02: 10 mL

## 2021-04-02 MED ORDER — CEFAZOLIN SODIUM-DEXTROSE 2-4 GM/100ML-% IV SOLN
INTRAVENOUS | Status: AC
  Filled 2021-04-02: qty 100

## 2021-04-02 MED ORDER — ONDANSETRON HCL 4 MG PO TABS: 4.0000 mg | ORAL_TABLET | Freq: Four times a day (QID) | ORAL | Status: DC | PRN

## 2021-04-02 MED ORDER — OXYCODONE HCL 5 MG PO TABS
ORAL_TABLET | ORAL | Status: AC
  Filled 2021-04-02: qty 1

## 2021-04-02 MED ORDER — 0.9 % SODIUM CHLORIDE (POUR BTL) OPTIME
TOPICAL | Status: DC | PRN
  Administered 2021-04-02: 400 mL

## 2021-04-02 MED ORDER — FENTANYL CITRATE (PF) 100 MCG/2ML IJ SOLN
INTRAMUSCULAR | Status: DC | PRN
  Administered 2021-04-02 (×3): 25 ug via INTRAVENOUS

## 2021-04-02 MED ORDER — BUPIVACAINE HCL (PF) 0.5 % IJ SOLN
INTRAMUSCULAR | Status: AC
  Filled 2021-04-02: qty 30

## 2021-04-02 MED ORDER — MIDAZOLAM HCL 2 MG/2ML IJ SOLN
INTRAMUSCULAR | Status: DC | PRN
  Administered 2021-04-02: 1 mg via INTRAVENOUS

## 2021-04-02 MED ORDER — FAMOTIDINE 20 MG PO TABS
ORAL_TABLET | ORAL | Status: AC
  Administered 2021-04-02: 20 mg via ORAL
  Filled 2021-04-02: qty 1

## 2021-04-02 MED ORDER — ONDANSETRON HCL 4 MG/2ML IJ SOLN
INTRAMUSCULAR | Status: DC | PRN
  Administered 2021-04-02: 4 mg via INTRAVENOUS

## 2021-04-02 MED ORDER — CHLORHEXIDINE GLUCONATE 0.12 % MT SOLN: 15.0000 mL | Freq: Once | OROMUCOSAL | Status: AC

## 2021-04-02 MED ORDER — METOCLOPRAMIDE HCL 5 MG/ML IJ SOLN: 5.0000 mg | Freq: Three times a day (TID) | INTRAMUSCULAR | Status: DC | PRN

## 2021-04-02 MED ORDER — METOCLOPRAMIDE HCL 10 MG PO TABS: 5.0000 mg | ORAL_TABLET | Freq: Three times a day (TID) | ORAL | Status: DC | PRN

## 2021-04-02 MED ORDER — LIDOCAINE HCL (CARDIAC) PF 100 MG/5ML IV SOSY
PREFILLED_SYRINGE | INTRAVENOUS | Status: DC | PRN
  Administered 2021-04-02: 60 mg via INTRAVENOUS

## 2021-04-02 MED ORDER — ORAL CARE MOUTH RINSE
15.0000 mL | Freq: Once | OROMUCOSAL | Status: AC
Start: 2021-04-02 — End: 2021-04-02

## 2021-04-02 SURGICAL SUPPLY — 44 items
APL PRP STRL LF DISP 70% ISPRP (MISCELLANEOUS) ×1
APL SKNCLS STERI-STRIP NONHPOA (GAUZE/BANDAGES/DRESSINGS) ×1
BENZOIN TINCTURE PRP APPL 2/3 (GAUZE/BANDAGES/DRESSINGS) ×1 IMPLANT
BLADE DEBAKEY 8.0 (BLADE) ×1 IMPLANT
BLADE OSC/SAGITTAL 5.5X25 (BLADE) ×1 IMPLANT
BLADE SURG 15 STRL LF DISP TIS (BLADE) ×2 IMPLANT
BLADE SURG 15 STRL SS (BLADE) ×4
BNDG COHESIVE 4X5 TAN STRL (GAUZE/BANDAGES/DRESSINGS) ×1 IMPLANT
BNDG CONFORM 3 STRL LF (GAUZE/BANDAGES/DRESSINGS) IMPLANT
BNDG ELASTIC 3X5.8 VLCR STR LF (GAUZE/BANDAGES/DRESSINGS) ×3 IMPLANT
BNDG ESMARK 4X12 TAN STRL LF (GAUZE/BANDAGES/DRESSINGS) ×2 IMPLANT
BUR 4X55 1 (BURR) IMPLANT
CAST PADDING 3X4FT ST 30246 (SOFTGOODS) ×1
CHLORAPREP W/TINT 26 (MISCELLANEOUS) ×2 IMPLANT
CUFF TOURN SGL QUICK 18X4 (TOURNIQUET CUFF) ×2 IMPLANT
DRAPE FLUOR MINI C-ARM 54X84 (DRAPES) ×2 IMPLANT
DURAPREP 26ML APPLICATOR (WOUND CARE) ×2 IMPLANT
FORCEPS JEWEL BIP 4-3/4 STR (INSTRUMENTS) ×2 IMPLANT
GAUZE 4X4 16PLY ~~LOC~~+RFID DBL (SPONGE) ×2 IMPLANT
GAUZE XEROFORM 1X8 LF (GAUZE/BANDAGES/DRESSINGS) ×2 IMPLANT
GLOVE SURG ENC MOIS LTX SZ8 (GLOVE) ×4 IMPLANT
GLOVE SURG UNDER LTX SZ8 (GLOVE) ×2 IMPLANT
GOWN STRL REUS W/ TWL XL LVL3 (GOWN DISPOSABLE) ×1 IMPLANT
GOWN STRL REUS W/TWL XL LVL3 (GOWN DISPOSABLE) ×2
KIT TURNOVER KIT A (KITS) ×2 IMPLANT
MANIFOLD NEPTUNE II (INSTRUMENTS) ×2 IMPLANT
NS IRRIG 500ML POUR BTL (IV SOLUTION) ×2 IMPLANT
PACK EXTREMITY ARMC (MISCELLANEOUS) ×2 IMPLANT
PAD CAST CTTN 3X4 STRL (SOFTGOODS) ×2 IMPLANT
PADDING CAST COTTON 3X4 STRL (SOFTGOODS) ×1
PASSER SUT SWANSON 36MM LOOP (INSTRUMENTS) IMPLANT
SPLINT CAST 1 STEP 3X12 (MISCELLANEOUS) ×2 IMPLANT
STOCKINETTE IMPERVIOUS 9X36 MD (GAUZE/BANDAGES/DRESSINGS) ×2 IMPLANT
STOCKINETTE STRL 4IN 9604848 (GAUZE/BANDAGES/DRESSINGS) ×2 IMPLANT
STRIP CLOSURE SKIN 1/2X4 (GAUZE/BANDAGES/DRESSINGS) ×2 IMPLANT
SUT ETHIBOND 0 MO6 C/R (SUTURE) ×1 IMPLANT
SUT PROLENE 4 0 PS 2 18 (SUTURE) ×2 IMPLANT
SUT VIC AB 2-0 CT2 27 (SUTURE) ×2 IMPLANT
SUT VIC AB 2-0 SH 27 (SUTURE) ×2
SUT VIC AB 2-0 SH 27XBRD (SUTURE) ×1 IMPLANT
SUT VIC AB 3-0 SH 27 (SUTURE) ×2
SUT VIC AB 3-0 SH 27X BRD (SUTURE) ×1 IMPLANT
SYSTEM IMPLANT TIGHTROPE MINI (Anchor) ×1 IMPLANT
WIRE Z .062 C-WIRE SPADE TIP (WIRE) IMPLANT

## 2021-04-02 NOTE — Anesthesia Procedure Notes (Signed)
Procedure Name: LMA Insertion Date/Time: 04/02/2021 9:38 AM Performed by: Posey Pronto, Aariya Ferrick, CRNA Pre-anesthesia Checklist: Patient identified, Patient being monitored, Timeout performed, Emergency Drugs available and Suction available Patient Re-evaluated:Patient Re-evaluated prior to induction Oxygen Delivery Method: Circle system utilized Preoxygenation: Pre-oxygenation with 100% oxygen Induction Type: IV induction Ventilation: Mask ventilation without difficulty LMA: LMA inserted LMA Size: 4.0 Tube type: Oral Number of attempts: 1 Placement Confirmation: positive ETCO2 and breath sounds checked- equal and bilateral Tube secured with: Tape Dental Injury: Teeth and Oropharynx as per pre-operative assessment  Comments: Eyes taped prior to LMA placement, atraumatic.

## 2021-04-02 NOTE — Op Note (Signed)
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  Post-Op Diagnosis:   Same.  Procedure:   Suspension arthroplasty right thumb CMC joint.   Surgeon:   Pascal Lux, MD  Assistant:   Ardelle Park, PA-S  Anesthesia:   General LMA  Findings:   As above.  Complications:   None  EBL:   1 cc  Fluids:   500 cc crystalloid  TT:   58 minutes at 250 mmHg  Drains:   None  Closure:   3-0 Vicryl subcuticular sutures and 4-0 Prolene interrupted sutures  Implants:   Arthrex 1.1 mm CMC Mini Tightrope.  Brief Clinical Note:   The patient is a 69 year old female who is now approximately 18 years status post an interposition arthroplasty for degenerative joint disease of her right thumb CMC joint.  Over the past few years, she has begun to notice increasing pain in the base of her right thumb. The patient's symptoms have progressed despite medications, activity modification, injections, splinting, etc. The patient's history and examination are consistent with progressive recurrent degenerative joint disease of the right thumb CMC joint. Preoperative plain radiographs demonstrate impingement of the base of the thumb metacarpal on the distal scaphoid with degenerative changes. The patient presents at this time for a revision suspension arthroplasty of the right thumb CMC joint.  Procedure:    The patient was brought into the operating room and lain in the supine position. After adequate general laryngeal mask anesthesia was obtained, the patient's right hand and upper extremity were prepped with ChloraPrep solution before being draped sterilely. Preoperative antibiotics were administered. After verifying the appropriate surgical site with a timeout, the limb was exsanguinated with an Esmarch and the tourniquet inflated to 250 mmHg.   An approximately 3-4 cm  longitudinal incision was made centered over the radial aspect of the thumb CMC joint. The incision was carried down through the subcutaneous cutaneous tissues with care taken to identify and protect the local neurovascular structures. Several small veins were cauterized with bipolar electrocautery. A longitudinal incision was made through the capsular tissues over the dorsal aspect of the thumb CMC joint. Subperiosteal dissection was carried out to expose the base of the thumb metacarpal. The base of the thumb metacarpal was prepared by exposing its radial base.  The Arthrex 1.1 mm guidewire was drilled up through the proximal end of the thumb metacarpal beginning at the dorsoradial surface and advancing into the volar radial aspect of the proximal index metacarpal shaft. The adequacy of pin position was verified using FluoroScan imaging in AP, lateral, and oblique projections and found to be excellent.  An approximate 1.5 cm incision was made over the dorsal aspect of the proximal end of the index metacarpal. The incision was carried down through the subcutaneous tissues with care taken to avoid damaging the extensor tendon. The interosseous muscle was dissected away from the dorso-ulnar aspect of the proximal index metacarpal shaft before the guidewire was advanced through the final cortex into this wound. The suture was passed through the nitinol wire loop and pulled across the thumb and index metacarpals, seating the mini Endobutton against the proximal end of the thumb metacarpal. The suture was cut and each and passed through the second mini Endobutton. The Endobutton was advanced to rest against the dorsal ulnar aspect of the proximal index metacarpal shaft before it was tied securely with  appropriate tension to stabilize the base of the thumb metacarpal against the base of the index metacarpal. Again, the adequacy of Endobutton position and metacarpal reduction was verified fluoroscopically in AP,  lateral, and oblique projections and found to be excellent. The thumb was then placed through a range of motion. It was able to be abducted and adducted fully and remained distracted to gentle axial loading.  The wounds were copiously irrigated with sterile saline solution using bulb irrigation. The thumb CMC capsular tissues were reapproximated using 2-0 Vicryl interrupted sutures before the skin was closed using 3-0 Vicryl subcuticular interrupted sutures. Benzoin and Steri-Strips were applied to the skin. The skin of the more dorsal incision was closed using 4-0 Prolene interrupted sutures. A total of 10 cc of 0.5% plain Sensorcaine was injected in and around both incisions to help with postoperative analgesia. A sterile bulky dressing was applied to the wounds before the patient was placed into a fiberglass thumb spica splint maintaining the thumb in the position of function. The patient was then awakened, extubated, and returned to the recovery room in satisfactory condition after tolerating the procedure well.

## 2021-04-02 NOTE — Anesthesia Preprocedure Evaluation (Signed)
Anesthesia Evaluation  Patient identified by MRN, date of birth, ID band Patient awake    Reviewed: Allergy & Precautions, H&P , NPO status , Patient's Chart, lab work & pertinent test results, reviewed documented beta blocker date and time   History of Anesthesia Complications (+) history of anesthetic complications  Airway Mallampati: II  TM Distance: >3 FB Neck ROM: full    Dental  (+) Poor Dentition   Pulmonary neg pulmonary ROS, former smoker,    Pulmonary exam normal        Cardiovascular Exercise Tolerance: Good + dysrhythmias Atrial Fibrillation  Rhythm:regular Rate:Normal     Neuro/Psych  Headaches, PSYCHIATRIC DISORDERS Depression    GI/Hepatic negative GI ROS, Neg liver ROS,   Endo/Other  negative endocrine ROS  Renal/GU negative Renal ROS  negative genitourinary   Musculoskeletal   Abdominal   Peds  Hematology negative hematology ROS (+)   Anesthesia Other Findings Past Medical History: No date: Adenomatous colon polyp No date: Allergy     Comment:  SEASONAL No date: Complication of anesthesia     Comment:  reports that she has given a medication that mades her               relax but it make her anxious, requesting not to have               this medication, does not know the name but it is given               through the IV. No date: Depression No date: Dysrhythmia     Comment:  AFIB No date: Gallstones No date: Hyperlipidemia No date: Osteoarthritis Past Surgical History: No date: APPENDECTOMY No date: BREAST ENHANCEMENT SURGERY 01/06/2015: CATARACT EXTRACTION W/ INTRAOCULAR LENS  IMPLANT,  BILATERAL 09/07/1988: CHOLECYSTECTOMY No date: COLONOSCOPY No date: KNEE ARTHROSCOPY     Comment:  right No date: partial vaginal hysterectomy No date: THUMB ARTHROSCOPY     Comment:  bilateral No date: TONSILLECTOMY BMI    Body Mass Index: 21.93 kg/m     Reproductive/Obstetrics negative OB  ROS                             Anesthesia Physical Anesthesia Plan  ASA: 3  Anesthesia Plan: General   Post-op Pain Management:    Induction:   PONV Risk Score and Plan: 4 or greater  Airway Management Planned:   Additional Equipment:   Intra-op Plan:   Post-operative Plan:   Informed Consent: I have reviewed the patients History and Physical, chart, labs and discussed the procedure including the risks, benefits and alternatives for the proposed anesthesia with the patient or authorized representative who has indicated his/her understanding and acceptance.     Dental Advisory Given  Plan Discussed with: CRNA  Anesthesia Plan Comments:         Anesthesia Quick Evaluation

## 2021-04-02 NOTE — H&P (Signed)
History of Present Illness:  [name] is a 70 y.o. female who presents for evaluation and treatment of her recurrent chronic basilar right thumb pain. The patient is now 16 years status post an interposition arthroplasty of the right thumb CMC joint. Initially, the patient did quite well, but over the past few years has began to notice gradually worsening pain and weakness to the right thumb and hand. She has been taking Tylenol and/or Advil as necessary with temporary partial relief of her symptoms. Her symptoms are aggravated by any repetitive activities, and she has difficulty grasping firmly or holding objects. She denies any specific reinjury to the thumb, and denies any numbness or paresthesias to her thumb.  Current Outpatient Medications:  apixaban (ELIQUIS) 5 mg tablet Take 1 tablet (5 mg total) by mouth every 12 (twelve) hours 180 tablet 3   BIOTIN ORAL Take by mouth 2 gummies once daily   cetirizine HCl (ZYRTEC ORAL) Take by mouth once daily   COLLAGEN MISC Use 1 Tbsp once daily   ibuprofen (MOTRIN) 800 MG tablet Take 800 mg by mouth 2 (two) times daily   metoprolol tartrate (LOPRESSOR) 25 MG tablet Take 0.5 tablets (12.5 mg total) by mouth 2 (two) times daily 90 tablet 3   Allergies:   Naproxen Headache   Other Other (See Comments)  INFLUENZA vaccine 40+ yrs ago = chills, sweats   Feathers Other (See Comments)  Congestion   Lidocaine Hives  Xylocaine = hives   Mobic [Meloxicam] Palpitations   Tramadol Other (See Comments)  Insomnia   Past Medical History:   Allergic state   Arrhythmia   Colon polyp 08/09/2015   Depression 08/09/2015   Gall stones 08/09/2015   Generalized osteoarthritis 08/09/2015   Heart palpitations 08/09/2015   History of cataract 2015  had surgery both eyes 01/2015 cause - Prednisone   HLD (hyperlipidemia) 08/09/2015   IBS (irritable bowel syndrome) 08/12/2015   Low back pain 08/09/2015   Migraine with aura 08/09/2015   Paroxysmal atrial  fibrillation (CMS-HCC) 08/12/2015   Past Surgical History:   APPENDECTOMY   BREAST SURGERY   CATARACT EXTRACTION Bilateral 2016   CHOLECYSTECTOMY 1990   COLONOSCOPY 12/03/2003  Normal Colon   COLONOSCOPY 06/05/2011  Adenomatous Polyp ([place])   COLONOSCOPY 08/04/2016  PH Adenomatous Polyp: CBF 07/2021   EGD 06/05/2011  Dr. Ulice Dash Pyrtle @ [place] per patient   [place] Bilateral 2003 and 2005 (trapeziectomy with FCR suspension)   KNEE ARTHROSCOPY Right [place]   Family History:   High blood pressure (Hypertension) Mother   Stroke Mother   Osteoporosis (Thinning of bones) Mother   Heart failure Father   High blood pressure (Hypertension) Father   Dementia Father   Diabetes type II Father   Myocardial Infarction (Heart attack) Father   Cancer Father   Arthritis Father   Alzheimer's disease Father   Coronary Artery Disease (Blocked arteries around heart) Father   Hyperlipidemia (Elevated cholesterol) Father   Osteoarthritis Father (hands)   No Known Problems Brother   Cancer Maternal Aunt   Cancer Maternal Uncle   Social History:   Socioeconomic History:   Marital status: Married   Number of children: 4  Occupational History   Occupation: retired  Tobacco Use   Smoking status: Former Smoker  Types: Cigarettes  Quit date: 08/12/1995  Years since quitting: 25.5  Smokeless tobacco: Never Used  Vaping Use   Vaping Use: Never used  Substance and Sexual Activity   Alcohol use: Yes  Alcohol/week: 0.0 standard drinks  Comment: Occasional drink   Drug use: No   Sexual activity: Yes  Partners: Male  Birth control/protection: Post-menopausal  Comment: Husband   Review of Systems:  A comprehensive 14 point ROS was performed, reviewed, and the pertinent orthopaedic findings are documented in the HPI.  Physical Exam: Vitals:  02/14/21 1314  BP:  122/68  Weight: 64 kg (141 lb 3.2 oz)  Height: 170.2 cm ('5\' 7"'$ )  PainSc: 2  PainLoc: Finger   General/Constitutional: The patient appears to be well-nourished, well-developed, and in no acute distress. Neuro/Psych: Normal mood and affect, oriented to person, place and time. Eyes: Non-icteric. Pupils are equal, round, and reactive to light, and exhibit synchronous movement. ENT: Unremarkable. Lymphatic: No palpable adenopathy. Respiratory: Lungs clear to auscultation, Normal chest excursion, No wheezes and Non-labored breathing Cardiovascular: Regular rate and rhythm. No murmurs. and No edema, swelling or tenderness, except as noted in detailed exam. Integumentary: No impressive skin lesions present, except as noted in detailed exam. Musculoskeletal: Unremarkable, except as noted in detailed exam.  Right hand exam: Skin inspection of the right hand demonstrates a well-healed surgical incision along the radial aspect of the base of her right thumb, but otherwise is unremarkable. No swelling, erythema, ecchymosis, abrasions, or other skin abnormalities are identified. She has mild-moderate tenderness palpation over the base of her right thumb. She has a positive grind test. She exhibits full active and passive range of motion of all digits without any pain or triggering. She is neurovascularly intact to all digits.  X-rays/MRI/Lab data:  Recent AP, lateral, and oblique views of the right hand are available for review and have been reviewed by myself. These films demonstrate postsurgical changes consistent with prior trapeziectomy with complete collapse of the trapeziectomy space as the base of the thumb metacarpal now abuts the scaphoid, resulting in moderate degenerative changes in this area. No lytic lesions, other significant degenerative changes, or acute bony abnormalities are identified.  Assessment: Primary osteoarthritis of first carpometacarpal joint of right hand.   Plan: The  treatment options were discussed with the patient. In addition, patient educational materials were provided regarding the diagnosis and treatment options. The patient is quite frustrated by her persistent symptoms and functional limitations, and is ready to consider more aggressive treatment options. Therefore, I have recommended a surgical procedure, specifically a suspension arthroplasty of the right thumb CMC joint. The procedure was discussed with the patient, as were the potential risks (including bleeding, infection, nerve and/or blood vessel injury, persistent or recurrent pain/weakness, stiffness of the joint, need for further surgery, blood clots, strokes, heart attacks and/or arhythmias, pneumonia, etc.) and benefits. The patient states his/her understanding and wishes to proceed. All of the patient's questions and concerns were answered. She can call any time with further concerns. She will follow up post-surgery, routine.   H&P reviewed and patient re-examined. No changes.

## 2021-04-02 NOTE — Discharge Instructions (Addendum)
Orthopedic discharge instructions: Keep splint dry and intact. Keep hand elevated above heart level. Apply ice to affected area frequently. Take ES Tylenol if needed for pain.  Return for follow-up in 10-14 days or as scheduled.AMBULATORY SURGERY  DISCHARGE INSTRUCTIONS   The drugs that you were given will stay in your system until tomorrow so for the next 24 hours you should not:  Drive an automobile Make any legal decisions Drink any alcoholic beverage   You may resume regular meals tomorrow.  Today it is better to start with liquids and gradually work up to solid foods.  You may eat anything you prefer, but it is better to start with liquids, then soup and crackers, and gradually work up to solid foods.   Please notify your doctor immediately if you have any unusual bleeding, trouble breathing, redness and pain at the surgery site, drainage, fever, or pain not relieved by medication.    Additional Instructions:   Please contact your physician with any problems or Same Day Surgery at [phone], Monday through Friday 6 am to 4 pm, or [place] at [facility] number at [phone].

## 2021-04-02 NOTE — Transfer of Care (Signed)
Immediate Anesthesia Transfer of Care Note  Patient: [name]  Procedure(s) Performed: REVISION SUSPENSION ARTHROPLASTY OF RIGHT THUMB [place] JOINT. (Right: Thumb)  Patient Location: PACU  Anesthesia Type:General  Level of Consciousness: drowsy and patient cooperative  Airway & Oxygen Therapy: Patient Spontanous Breathing and Patient connected to face mask oxygen  Post-op Assessment: Report given to RN and Post -op Vital signs reviewed and stable  Post vital signs: Reviewed and stable  Last Vitals:  Vitals Value Taken Time  BP 126/83 04/02/21 1106  Temp    Pulse 75 04/02/21 1108  Resp 16 04/02/21 1108  SpO2 98 % 04/02/21 1108  Vitals shown include unvalidated device data.  Last Pain:  Vitals:   04/02/21 0818  TempSrc: Oral  PainSc: 5          Complications: No notable events documented.

## 2021-04-07 NOTE — Anesthesia Postprocedure Evaluation (Signed)
Anesthesia Post Note  Patient: [name]  Procedure(s) Performed: REVISION SUSPENSION ARTHROPLASTY OF RIGHT THUMB [place] JOINT. (Right: Thumb)  Patient location during evaluation: PACU Anesthesia Type: General Level of consciousness: awake and alert Pain management: pain level controlled Vital Signs Assessment: post-procedure vital signs reviewed and stable Respiratory status: spontaneous breathing, nonlabored ventilation, respiratory function stable and patient connected to nasal cannula oxygen Cardiovascular status: blood pressure returned to baseline and stable Postop Assessment: no apparent nausea or vomiting Anesthetic complications: no   No notable events documented.   Last Vitals:  Vitals:   04/02/21 1145 04/02/21 1202  BP:  138/63  Pulse:  (!) 55  Resp:  16  Temp: (!) 36.3 C (!) 36.4 C  SpO2:  97%    Last Pain:  Vitals:   04/03/21 0919  TempSrc:   PainSc: [name]

## 2021-05-26 ENCOUNTER — Encounter: Payer: Self-pay | Admitting: Occupational Therapy

## 2021-05-26 ENCOUNTER — Ambulatory Visit: Payer: Medicare Other | Attending: Surgery | Admitting: Occupational Therapy

## 2021-05-26 DIAGNOSIS — M79641 Pain in right hand: Secondary | ICD-10-CM | POA: Insufficient documentation

## 2021-05-26 DIAGNOSIS — M6281 Muscle weakness (generalized): Secondary | ICD-10-CM | POA: Insufficient documentation

## 2021-05-26 DIAGNOSIS — L905 Scar conditions and fibrosis of skin: Secondary | ICD-10-CM | POA: Diagnosis present

## 2021-05-26 DIAGNOSIS — M25531 Pain in right wrist: Secondary | ICD-10-CM | POA: Insufficient documentation

## 2021-05-26 DIAGNOSIS — M25631 Stiffness of right wrist, not elsewhere classified: Secondary | ICD-10-CM | POA: Insufficient documentation

## 2021-05-26 DIAGNOSIS — M25641 Stiffness of right hand, not elsewhere classified: Secondary | ICD-10-CM | POA: Insufficient documentation

## 2021-05-26 NOTE — Therapy (Signed)
[facility] [address] [place], [address] Phone: [phone]   Fax:  [phone]  Occupational Therapy Evaluation  Patient Details  Name: [name] MRN: [id number] Date of Birth: 25-Sep-1950 Referring Provider (OT): Dr Roland Rack   Encounter Date: 05/26/2021   OT End of Session - 05/26/21 2207     Visit Number 1    Number of Visits 12    Date for OT Re-Evaluation 07/07/21    OT Start Time 1122    OT Stop Time 1214    OT Time Calculation (min) 52 min    Activity Tolerance Patient tolerated treatment well    Behavior During Therapy [facility] for tasks assessed/performed             Past Medical History:  Diagnosis Date   Adenomatous colon polyp    Allergy    SEASONAL   Complication of anesthesia    reports that she has given a medication that mades her relax but it make her anxious, requesting not to have this medication, does not know the name but it is given through the IV.   Depression    Dysrhythmia    AFIB   Gallstones    Hyperlipidemia    Osteoarthritis     Past Surgical History:  Procedure Laterality Date   APPENDECTOMY     BREAST ENHANCEMENT SURGERY     CARPOMETACARPAL ([place]) FUSION OF THUMB Right 04/02/2021   Procedure: REVISION SUSPENSION ARTHROPLASTY OF RIGHT THUMB [facility] JOINT.;  Surgeon: Corky Mull, MD;  Location: [facility] ORS;  Service: Orthopedics;  Laterality: Right;   CATARACT EXTRACTION W/ INTRAOCULAR LENS  IMPLANT, BILATERAL  01/06/2015   CHOLECYSTECTOMY  09/07/1988   COLONOSCOPY     KNEE ARTHROSCOPY     right   partial vaginal hysterectomy     THUMB ARTHROSCOPY     bilateral   TONSILLECTOMY      There were no vitals filed for this visit.   Subjective Assessment - 05/26/21 2159     Subjective  I have been in cast and then splint - doing okay -but did not really used it -did some - to see what I can do - but pain with pushing, pulling , gripping or squeezing washcloth     Pertinent History [name] is a 70 y.o. 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 Had her first one about 16 yrs ago , Overall, the patient feels that she is doing quite well. She denies any pain in the thumb on today's visit, but will take an occasional Tylenol as necessary with relief. She has no complaints in her cast. Pt refer to OT for rehab of R hand    Patient Stated Goals Want to be able to use my R hand to do things around the house , walk the dog , squeeze, lift and carry objects without pain    Currently in Pain? Yes    Pain Score 2     Pain Location Hand    Pain Orientation Right    Pain Descriptors / Indicators Aching;Tightness;Tender    Pain Type Surgical pain    Pain Onset More than a month ago    Pain Frequency Intermittent               [facility] OT Assessment - 05/26/21 0001       Assessment  Medical Diagnosis R thumb CMC arthroplasty revision    Referring Provider (OT) Dr Roland Rack    Onset Date/Surgical Date 04/02/21    Hand Dominance Right      Home  Environment   Lives With Spouse      Prior Function   Vocation Retired    Leisure Dogs, read, some DIY projects around house , walk      AROM   Right Wrist Extension 58 Degrees    Right Wrist Flexion 62 Degrees    Right Wrist Radial Deviation 8 Degrees    Right Wrist Ulnar Deviation 30 Degrees    Left Wrist Extension 80 Degrees    Left Wrist Flexion 95 Degrees    Left Wrist Radial Deviation 24 Degrees    Left Wrist Ulnar Deviation 30 Degrees      Right Hand AROM   R Thumb MCP 0-60 35 Degrees    R Thumb IP 0-80 55 Degrees    R Thumb Radial ABduction/ADduction 0-55 42    R Thumb Palmar ABduction/ADduction 0-45 48    R Thumb Opposition to Index --   Opposition to 5th - pull            Pt can start light scar massage  And 2 hrs on and off out of thumb spica -into CMC neoprene splint  Heat 2-3 x day   AAROM over edge of table for wrist flexion, ext, RD, UD  10reps Thumb circular , PA and RA  12 reps AROM  AAROM for thumb IP and MC flexion -  Opposition pick up small object alternate digits  8 reps each  Pain free- only slight pull or stretch - less than 2/10                  OT Education - 05/26/21 2207     Education Details findings of eval and HEP    Person(s) Educated Patient    Methods Explanation;Demonstration;Tactile cues;Handout;Verbal cues    Comprehension Verbal cues required;Returned demonstration;Verbalized understanding              OT Short Term Goals - 05/26/21 2213       OT SHORT TERM GOAL #1   Title Pt to be independent  in HEP to increase AROM in thumb and wrist to wean out of spilnt more than 75 % of time without increas symptoms    Baseline pain with AROM 2/10 - but increase with use - thumb spica mroe than 75% of the time on    Time 3    Period Weeks    Status New    Target Date 06/16/21               OT Long Term Goals - 05/26/21 2214       OT LONG TERM GOAL #1   Title R wrist AROM increase to WNL for pt to push and pull door open without increase symptoms    Baseline Wrist flexion 62 and ext 58 - pain - RD 8 degrees- splint 75% of time    Time 4    Period Weeks    Status New    Target Date 06/23/21      OT LONG TERM GOAL #2   Title R thumb AROM increase to WNL to retrieve objedt out of palm , hold glass and pick up 3 point with no increase symptoms    Baseline decrease thumb AROM in all planes - RA 42, PA 48 , IP  flexion 55, MC 35 degrees - pain    Time 6    Period Weeks    Status New    Target Date 07/07/21      OT LONG TERM GOAL #3   Title Pt R grip and prehension strength increase to more than 60% compare to L hand to carry plate, more than 5lbs and buttons with pain less than 2/10    Baseline NT - pt eval and still in thumb spica more than 75% of the time -pain with AROM    Time 6    Period Weeks    Status  New    Target Date 07/07/21                   Plan - 05/26/21 2208     Clinical Impression Statement Pt present at OT eval with R thumb revision of CMC arthroplasty with tight rope tech on 04/02/21 - had previous [place] arthroplasty about 16 yrs ago - pt now about 7 1/2 wks s/p - pt with decrease AROM in R thumb and wrist in all planes - decrease strength and increase pain and scar tissue- all limitng her functional use of R dominant hand in ADL's and IADl's - pt can benefit from skilled OT services    OT Occupational Profile and History Problem Focused Assessment - Including review of records relating to presenting problem    Occupational performance deficits (Please refer to evaluation for details): ADL's;IADL's;Play;Leisure;Social Participation    Body Structure / Function / Physical Skills ADL;FMC;Decreased knowledge of precautions;Dexterity;Strength;Pain;UE functional use;IADL;ROM;Scar mobility;Flexibility    Rehab Potential Good    Clinical Decision Making Limited treatment options, no task modification necessary    Comorbidities Affecting Occupational Performance: None    Modification or Assistance to Complete Evaluation  No modification of tasks or assist necessary to complete eval    OT Frequency 2x / week    OT Duration 6 weeks    OT Treatment/Interventions Self-care/ADL training;Fluidtherapy;DME and/or AE instruction;Splinting;Therapeutic activities;Scar mobilization;Paraffin;Manual Therapy;Patient/family education;Passive range of motion    Consulted and Agree with Plan of Care Patient             Patient will benefit from skilled therapeutic intervention in order to improve the following deficits and impairments:   Body Structure / Function / Physical Skills: ADL, FMC, Decreased knowledge of precautions, Dexterity, Strength, Pain, UE functional use, IADL, ROM, Scar mobility, Flexibility       Visit Diagnosis: Pain in right hand - Plan: Ot plan of care  cert/re-cert  Pain in right wrist - Plan: Ot plan of care cert/re-cert  Stiffness of right hand, not elsewhere classified - Plan: Ot plan of care cert/re-cert  Stiffness of right wrist, not elsewhere classified - Plan: Ot plan of care cert/re-cert  Scar tissue - Plan: Ot plan of care cert/re-cert  Muscle weakness (generalized) - Plan: Ot plan of care cert/re-cert    Problem List Patient Active Problem List   Diagnosis Date Noted   Migraine with aura 05/25/2014   Hx of adenomatous colonic polyps 11/01/2012   Routine general medical examination at a health care facility 10/09/2011   Allergic rhinitis due to pollen 10/09/2011   Hyperlipemia 11/06/2009   OSTEOARTHRITIS 11/06/2009    Amrit Cress, Gwenette Greet, OTR/L, CLT 05/26/2021, 10:20 PM  [facility] [address] [place], [address] Phone: [phone]   Fax:  [phone]  Name: [name] MRN: [id number] Date of Birth: May 10, 1951

## 2021-06-02 ENCOUNTER — Ambulatory Visit: Payer: Medicare Other | Admitting: Occupational Therapy

## 2021-06-02 DIAGNOSIS — M79641 Pain in right hand: Secondary | ICD-10-CM

## 2021-06-02 DIAGNOSIS — L905 Scar conditions and fibrosis of skin: Secondary | ICD-10-CM

## 2021-06-02 DIAGNOSIS — M25631 Stiffness of right wrist, not elsewhere classified: Secondary | ICD-10-CM

## 2021-06-02 DIAGNOSIS — M6281 Muscle weakness (generalized): Secondary | ICD-10-CM

## 2021-06-02 DIAGNOSIS — M25531 Pain in right wrist: Secondary | ICD-10-CM

## 2021-06-02 DIAGNOSIS — M25641 Stiffness of right hand, not elsewhere classified: Secondary | ICD-10-CM

## 2021-06-02 NOTE — Therapy (Signed)
[facility] [address] [place], [address] Phone: [phone]   Fax:  [phone]  Occupational Therapy Treatment  Patient Details  Name: [name] MRN: [id number] Date of Birth: 05/22/1951 Referring Provider (OT): Dr Roland Rack   Encounter Date: 06/02/2021   OT End of Session - 06/02/21 1604     Visit Number 2    Number of Visits 12    Date for OT Re-Evaluation 07/07/21    OT Start Time 1347    OT Stop Time 1431    OT Time Calculation (min) 44 min    Activity Tolerance Patient tolerated treatment well    Behavior During Therapy [facility] for tasks assessed/performed             Past Medical History:  Diagnosis Date   Adenomatous colon polyp    Allergy    SEASONAL   Complication of anesthesia    reports that she has given a medication that mades her relax but it make her anxious, requesting not to have this medication, does not know the name but it is given through the IV.   Depression    Dysrhythmia    AFIB   Gallstones    Hyperlipidemia    Osteoarthritis     Past Surgical History:  Procedure Laterality Date   APPENDECTOMY     BREAST ENHANCEMENT SURGERY     CARPOMETACARPAL ([place]) FUSION OF THUMB Right 04/02/2021   Procedure: REVISION SUSPENSION ARTHROPLASTY OF RIGHT THUMB [facility] JOINT.;  Surgeon: Corky Mull, MD;  Location: [facility] ORS;  Service: Orthopedics;  Laterality: Right;   CATARACT EXTRACTION W/ INTRAOCULAR LENS  IMPLANT, BILATERAL  01/06/2015   CHOLECYSTECTOMY  09/07/1988   COLONOSCOPY     KNEE ARTHROSCOPY     right   partial vaginal hysterectomy     THUMB ARTHROSCOPY     bilateral   TONSILLECTOMY      There were no vitals filed for this visit.   Subjective Assessment - 06/02/21 1411     Subjective  Doing well - thumb feels okay- my wrist hurts some times and click at times  , using the splint probably about 50% -but because of doing things around the house  - like cabinets-  and around grandkids and they have 2 big dogs -    Pertinent History [name] is a 70 y.o. 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 Had her first one about 16 yrs ago , Overall, the patient feels that she is doing quite well. She denies any pain in the thumb on today's visit, but will take an occasional Tylenol as necessary with relief. She has no complaints in her cast. Pt refer to OT for rehab of R hand    Patient Stated Goals Want to be able to use my R hand to do things around the house , walk the dog , squeeze, lift and carry objects without pain    Currently in Pain? No/denies                [facility] OT Assessment - 06/02/21 0001       AROM   Right Wrist Extension 75 Degrees    Right Wrist Flexion 90 Degrees    Right Wrist Radial Deviation 15 Degrees    Right Wrist Ulnar Deviation 35 Degrees      Strength   Right Hand  Grip (lbs) 40    Right Hand Lateral Pinch 7 lbs    Right Hand 3 Point Pinch 7 lbs    Left Hand Grip (lbs) 38    Left Hand Lateral Pinch 9 lbs      Right Hand AROM   R Thumb MCP 0-60 40 Degrees    R Thumb IP 0-80 65 Degrees    R Thumb Radial ABduction/ADduction 0-55 53    R Thumb Palmar ABduction/ADduction 0-45 63            Pt show increase wrist and thumb AROM - and grip and prehension assess this date   See flow sheet           OT Treatments/Exercises (OP) - 06/02/21 0001       RUE Fluidotherapy   Number Minutes Fluidotherapy 8 Minutes    RUE Fluidotherapy Location Hand;Wrist    Comments AROM for thumb and wrist in all planes              Soft tissue mobs to webspace and MC spreads prior to ROM   cont to wean out of thumb spica into soft splint - pt report trusting her hand and wrist more than last week  Heat 2 x day  AAROM over edge of table for wrist flexion, ext, RD, UD to cont with 10reps Thumb circular , PA and RA  12 reps  AROM  AAROM for thumb IP and MC flexion -  Opposition pick up small object alternate digits  8 reps each  Pain free- only slight pull or stretch - less than 2/10   Add this date 16 oz hammer or 1 lbs for wrist in all planes - 12 reps- pain free  2 x day and  Rubber band for thumb PA and RA 12 reps 2  day           OT Education - 06/02/21 1604     Education Details progress and HEP    Person(s) Educated Patient    Methods Explanation;Demonstration;Tactile cues;Handout;Verbal cues    Comprehension Verbal cues required;Returned demonstration;Verbalized understanding              OT Short Term Goals - 05/26/21 2213       OT SHORT TERM GOAL #1   Title Pt to be independent  in HEP to increase AROM in thumb and wrist to wean out of spilnt more than 75 % of time without increas symptoms    Baseline pain with AROM 2/10 - but increase with use - thumb spica mroe than 75% of the time on    Time 3    Period Weeks    Status New    Target Date 06/16/21               OT Long Term Goals - 05/26/21 2214       OT LONG TERM GOAL #1   Title R wrist AROM increase to WNL for pt to push and pull door open without increase symptoms    Baseline Wrist flexion 62 and ext 58 - pain - RD 8 degrees- splint 75% of time    Time 4    Period Weeks    Status New    Target Date 06/23/21      OT LONG TERM GOAL #2   Title R thumb AROM increase to WNL to retrieve objedt out of palm , hold glass and pick up 3 point with no increase symptoms  Baseline decrease thumb AROM in all planes - RA 42, PA 48 , IP flexion 55, MC 35 degrees - pain    Time 6    Period Weeks    Status New    Target Date 07/07/21      OT LONG TERM GOAL #3   Title Pt R grip and prehension strength increase to more than 60% compare to L hand to carry plate, more than 5lbs and buttons with pain less than 2/10    Baseline NT - pt eval and still in thumb spica more than 75% of the time -pain with AROM    Time 6     Period Weeks    Status New    Target Date 07/07/21                   Plan - 06/02/21 1605     Clinical Impression Statement Pt present at OT eva last weekl with R thumb revision of CMC arthroplasty with tight rope tech on 04/02/21 - had previous [place] arthroplasty about 16 yrs ago - pt this date about 8 1/2 wks s/p - and show good AROM in wrist and R thumb - but decrease strength- grip 40lbsR , L 38 this date -but prehension decrease but no pain - add weight for wrist in all planes this date and rubberband for thumb PA and RA - tolerated well - pt to cont strenthening pain free and increase use without pain  -Pt cont to show decrease strength with some pain with wrist end range- all limitng her functional use of R dominant hand in ADL's and IADl's - pt can benefit from skilled OT services    OT Occupational Profile and History Problem Focused Assessment - Including review of records relating to presenting problem    Occupational performance deficits (Please refer to evaluation for details): ADL's;IADL's;Play;Leisure;Social Participation    Body Structure / Function / Physical Skills ADL;FMC;Decreased knowledge of precautions;Dexterity;Strength;Pain;UE functional use;IADL;ROM;Scar mobility;Flexibility    Rehab Potential Good    Clinical Decision Making Limited treatment options, no task modification necessary    Comorbidities Affecting Occupational Performance: None    Modification or Assistance to Complete Evaluation  No modification of tasks or assist necessary to complete eval    OT Frequency 2x / week    OT Duration 6 weeks    OT Treatment/Interventions Self-care/ADL training;Fluidtherapy;DME and/or AE instruction;Splinting;Therapeutic activities;Scar mobilization;Paraffin;Manual Therapy;Patient/family education;Passive range of motion    Consulted and Agree with Plan of Care Patient             Patient will benefit from skilled therapeutic intervention in order to improve the  following deficits and impairments:   Body Structure / Function / Physical Skills: ADL, FMC, Decreased knowledge of precautions, Dexterity, Strength, Pain, UE functional use, IADL, ROM, Scar mobility, Flexibility       Visit Diagnosis: Pain in right hand  Pain in right wrist  Stiffness of right hand, not elsewhere classified  Stiffness of right wrist, not elsewhere classified  Scar tissue  Muscle weakness (generalized)    Problem List Patient Active Problem List   Diagnosis Date Noted   Migraine with aura 05/25/2014   Hx of adenomatous colonic polyps 11/01/2012   Routine general medical examination at a health care facility 10/09/2011   Allergic rhinitis due to pollen 10/09/2011   Hyperlipemia 11/06/2009   OSTEOARTHRITIS 11/06/2009    Rosalyn Gess, OTR/L,CLT 06/02/2021, 4:09 PM  [facility]  [address] [place], [address] Phone: [phone]   Fax:  [phone]  Name: [name] MRN: [id number] Date of Birth: 08/13/1951

## 2021-06-05 ENCOUNTER — Ambulatory Visit: Payer: Medicare Other | Admitting: Occupational Therapy

## 2021-06-05 DIAGNOSIS — M25641 Stiffness of right hand, not elsewhere classified: Secondary | ICD-10-CM

## 2021-06-05 DIAGNOSIS — L905 Scar conditions and fibrosis of skin: Secondary | ICD-10-CM

## 2021-06-05 DIAGNOSIS — M25531 Pain in right wrist: Secondary | ICD-10-CM

## 2021-06-05 DIAGNOSIS — M79641 Pain in right hand: Secondary | ICD-10-CM

## 2021-06-05 NOTE — Therapy (Signed)
[facility] [address], [place], [address] Phone: [phone]   Fax:  [phone]  Occupational Therapy Treatment  Patient Details  Name: [name] MRN: [id number] Date of Birth: 1951/08/21 Referring Provider (OT): Dr Roland Rack   Encounter Date: 06/05/2021   OT End of Session - 06/05/21 1302     Visit Number 3    Number of Visits 12    Date for OT Re-Evaluation 07/07/21    OT Start Time 1229    OT Stop Time 1300    OT Time Calculation (min) 31 min    Activity Tolerance Patient tolerated treatment well    Behavior During Therapy [facility] for tasks assessed/performed             Past Medical History:  Diagnosis Date   Adenomatous colon polyp    Allergy    SEASONAL   Complication of anesthesia    reports that she has given a medication that mades her relax but it make her anxious, requesting not to have this medication, does not know the name but it is given through the IV.   Depression    Dysrhythmia    AFIB   Gallstones    Hyperlipidemia    Osteoarthritis     Past Surgical History:  Procedure Laterality Date   APPENDECTOMY     BREAST ENHANCEMENT SURGERY     CARPOMETACARPAL ([place]) FUSION OF THUMB Right 04/02/2021   Procedure: REVISION SUSPENSION ARTHROPLASTY OF RIGHT THUMB [facility] JOINT.;  Surgeon: Corky Mull, MD;  Location: [facility] ORS;  Service: Orthopedics;  Laterality: Right;   CATARACT EXTRACTION W/ INTRAOCULAR LENS  IMPLANT, BILATERAL  01/06/2015   CHOLECYSTECTOMY  09/07/1988   COLONOSCOPY     KNEE ARTHROSCOPY     right   partial vaginal hysterectomy     THUMB ARTHROSCOPY     bilateral   TONSILLECTOMY      There were no vitals filed for this visit.   Subjective Assessment - 06/05/21 1300     Subjective  No problem doing the hammer and then slept without my splint last night - did okay - and using it more- so we don't know yet - plan is to leave for [facility] Monday    Pertinent  History [name] is a 70 y.o. 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 Had her first one about 16 yrs ago , Overall, the patient feels that she is doing quite well. She denies any pain in the thumb on today's visit, but will take an occasional Tylenol as necessary with relief. She has no complaints in her cast. Pt refer to OT for rehab of R hand    Patient Stated Goals Want to be able to use my R hand to do things around the house , walk the dog , squeeze, lift and carry objects without pain    Currently in Pain? No/denies                [facility] OT Assessment - 06/05/21 0001       Strength   Right Hand Grip (lbs) 42    Right Hand Lateral Pinch 7 lbs    Right Hand 3 Point Pinch 8 lbs    Left Hand Grip (lbs) 38    Left Hand Lateral Pinch 9 lbs    Left Hand 3 Point Pinch 10 lbs  Wrist AROM in all planes WNL      cont to wean out of thumb spica into soft splint - pt report trusting her hand and wrist more than last week  And can start sleeping without it Heat 2 x day if needed AAROM over edge of table for wrist flexion, ext, RD, UD to cont with 10reps Thumb circular , PA and RA  12 reps AROM  AAROM for thumb IP and MC flexion -  Opposition  alternate digits  8 reps each  Pain free- only slight pull or stretch - less than 2/10  For warm up    16 oz hammer or 1 lbs for wrist in all planes - 12 reps- pain free  2 sets  2 x day and increase in 3 days to 3rd set Rubber band for thumb PA 12 reps 2  day  Putty light blue putty for grip, lat and 3 point - 12-15 reps pain free  Increase to 2nd and 3rd  set in 3 and 6 days pain free  Can increase to 2 lbs and med teal putty in 10 days - while out of town if pain free -but drop sets and reps                  OT Education - 06/05/21 1302     Education Details progress and HEP    Person(s) Educated Patient     Methods Explanation;Demonstration;Tactile cues;Handout;Verbal cues    Comprehension Verbal cues required;Returned demonstration;Verbalized understanding              OT Short Term Goals - 05/26/21 2213       OT SHORT TERM GOAL #1   Title Pt to be independent  in HEP to increase AROM in thumb and wrist to wean out of spilnt more than 75 % of time without increas symptoms    Baseline pain with AROM 2/10 - but increase with use - thumb spica mroe than 75% of the time on    Time 3    Period Weeks    Status New    Target Date 06/16/21               OT Long Term Goals - 05/26/21 2214       OT LONG TERM GOAL #1   Title R wrist AROM increase to WNL for pt to push and pull door open without increase symptoms    Baseline Wrist flexion 62 and ext 58 - pain - RD 8 degrees- splint 75% of time    Time 4    Period Weeks    Status New    Target Date 06/23/21      OT LONG TERM GOAL #2   Title R thumb AROM increase to WNL to retrieve objedt out of palm , hold glass and pick up 3 point with no increase symptoms    Baseline decrease thumb AROM in all planes - RA 42, PA 48 , IP flexion 55, MC 35 degrees - pain    Time 6    Period Weeks    Status New    Target Date 07/07/21      OT LONG TERM GOAL #3   Title Pt R grip and prehension strength increase to more than 60% compare to L hand to carry plate, more than 5lbs and buttons with pain less than 2/10    Baseline NT - pt eval and still in thumb spica more than 75% of the  time -pain with AROM    Time 6    Period Weeks    Status New    Target Date 07/07/21                   Plan - 06/05/21 1303     Clinical Impression Statement Pt present at OT eval  R thumb revision of CMC arthroplasty with tight rope tech on 04/02/21 - had previous CMC arthroplasty about 16 yrs ago - pt this date about 9 wks s/p - and show AROM in wrist WNL and thumb WFL -but decrease strength- grip 42 lbsR , L 38 this date - prehension decrease but no  pain - Pt to cont to increase reps and sets for wrist using 1 lbs - and add light blue putty for grip and prehension - pain free - can increase to 2nd adn 3rd set gradually over the next week - and [place] 2 [place] -can incresae in about 10 days to 2 lbs weight and med putty but decrease reps and sets at that time - cont with rubberband for thumb PA - tolerated well - pt to cont strenthening pain free and increase use without pain  -Pt cont to show decrease strength- all limitng her functional use of R dominant hand in ADL's and IADl's - pt can benefit from skilled OT services    OT Occupational Profile and History Problem Focused Assessment - Including review of records relating to presenting problem    Occupational performance deficits (Please refer to evaluation for details): ADL's;IADL's;Play;Leisure;Social Participation    Body Structure / Function / Physical Skills ADL;FMC;Decreased knowledge of precautions;Dexterity;Strength;Pain;UE functional use;IADL;ROM;Scar mobility;Flexibility    Rehab Potential Good    Clinical Decision Making Limited treatment options, no task modification necessary    Comorbidities Affecting Occupational Performance: None    Modification or Assistance to Complete Evaluation  No modification of tasks or assist necessary to complete eval    OT Frequency 2x / week    OT Duration 6 weeks    OT Treatment/Interventions Self-care/ADL training;Fluidtherapy;DME and/or AE instruction;Splinting;Therapeutic activities;Scar mobilization;Paraffin;Manual Therapy;Patient/family education;Passive range of motion    Consulted and Agree with Plan of Care Patient             Patient will benefit from skilled therapeutic intervention in order to improve the following deficits and impairments:   Body Structure / Function / Physical Skills: ADL, FMC, Decreased knowledge of precautions, Dexterity, Strength, Pain, UE functional use, IADL, ROM, Scar mobility,  Flexibility       Visit Diagnosis: Pain in right hand  Pain in right wrist  Stiffness of right hand, not elsewhere classified  Scar tissue    Problem List Patient Active Problem List   Diagnosis Date Noted   Migraine with aura 05/25/2014   Hx of adenomatous colonic polyps 11/01/2012   Routine general medical examination at a health care facility 10/09/2011   Allergic rhinitis due to pollen 10/09/2011   Hyperlipemia 11/06/2009   OSTEOARTHRITIS 11/06/2009    Rosalyn Gess, OTR/L,CLT 06/05/2021, 1:07 PM  [facility] [address], [place], [address] Phone: [phone]   Fax:  [phone]  Name: [name] MRN: [id number] Date of Birth: [birth year]

## 2021-06-24 ENCOUNTER — Ambulatory Visit: Payer: Medicare Other | Attending: Surgery | Admitting: Occupational Therapy

## 2021-06-24 DIAGNOSIS — M25631 Stiffness of right wrist, not elsewhere classified: Secondary | ICD-10-CM

## 2021-06-24 DIAGNOSIS — M25531 Pain in right wrist: Secondary | ICD-10-CM | POA: Diagnosis present

## 2021-06-24 DIAGNOSIS — M25641 Stiffness of right hand, not elsewhere classified: Secondary | ICD-10-CM | POA: Diagnosis present

## 2021-06-24 DIAGNOSIS — L905 Scar conditions and fibrosis of skin: Secondary | ICD-10-CM | POA: Diagnosis present

## 2021-06-24 DIAGNOSIS — M79641 Pain in right hand: Secondary | ICD-10-CM | POA: Diagnosis not present

## 2021-06-24 NOTE — Therapy (Signed)
[facility] [address], [place], [address] Phone: [phone]   Fax:  [phone]  Occupational Therapy Treatment  Patient Details  Name: [name] MRN: [id number] Date of Birth: May 09, 1951 Referring Provider (OT): Dr Roland Rack   Encounter Date: 06/24/2021   OT End of Session - 06/24/21 1141     Visit Number 4    Number of Visits 12    Date for OT Re-Evaluation 07/07/21    OT Start Time 1116    OT Stop Time 1140    OT Time Calculation (min) 24 min    Activity Tolerance Patient tolerated treatment well    Behavior During Therapy [facility] for tasks assessed/performed             Past Medical History:  Diagnosis Date   Adenomatous colon polyp    Allergy    SEASONAL   Complication of anesthesia    reports that she has given a medication that mades her relax but it make her anxious, requesting not to have this medication, does not know the name but it is given through the IV.   Depression    Dysrhythmia    AFIB   Gallstones    Hyperlipidemia    Osteoarthritis     Past Surgical History:  Procedure Laterality Date   APPENDECTOMY     BREAST ENHANCEMENT SURGERY     CARPOMETACARPAL ([place]) FUSION OF THUMB Right 04/02/2021   Procedure: REVISION SUSPENSION ARTHROPLASTY OF RIGHT THUMB [facility] JOINT.;  Surgeon: Corky Mull, MD;  Location: [facility] ORS;  Service: Orthopedics;  Laterality: Right;   CATARACT EXTRACTION W/ INTRAOCULAR LENS  IMPLANT, BILATERAL  01/06/2015   CHOLECYSTECTOMY  09/07/1988   COLONOSCOPY     KNEE ARTHROSCOPY     right   partial vaginal hysterectomy     THUMB ARTHROSCOPY     bilateral   TONSILLECTOMY      There were no vitals filed for this visit.   Subjective Assessment - 06/24/21 1139     Subjective  We ended up going to the [place]- did some fishing, and boating- done okay with my exercises - using it in most everything - here and there if there are something heavy or wide I'll  back off    Pertinent History [name] is a 70 y.o. 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 Had her first one about 16 yrs ago , Overall, the patient feels that she is doing quite well. She denies any pain in the thumb on today's visit, but will take an occasional Tylenol as necessary with relief. She has no complaints in her cast. Pt refer to OT for rehab of R hand    Patient Stated Goals Want to be able to use my R hand to do things around the house , walk the dog , squeeze, lift and carry objects without pain    Currently in Pain? No/denies                [facility] OT Assessment - 06/24/21 0001       AROM   Right Wrist Extension 75 Degrees    Right Wrist Flexion 90 Degrees    Right Wrist Radial Deviation 20 Degrees    Right Wrist Ulnar Deviation 30 Degrees    Left Wrist Extension 80 Degrees    Left Wrist Flexion 95 Degrees  Left Wrist Radial Deviation 24 Degrees    Left Wrist Ulnar Deviation 30 Degrees      Strength   Right Hand Grip (lbs) 40    Right Hand Lateral Pinch 7 lbs    Right Hand 3 Point Pinch 10 lbs    Left Hand Grip (lbs) 39    Left Hand Lateral Pinch 9 lbs    Left Hand 3 Point Pinch 10 lbs             Wrist  AROM and strength WNL  Thumb AROM WFL -and strength still decrease in PA and RA  Pt cont to increase use of R hand in ADL's and IADL's pain free  Pt to cont with 3 more wks with HEP  Rubber band for thumb PA and RA 2 x 12 reps Upgrade pt to med firm green putty 15 reps   2 x day pain free for grip, lat and 3 point pinch  And then 2nd and then 3 sets  Cont at home with strengthening - and contact if need to follow up                 OT Education - 06/24/21 1140     Education Details progress and HEP    Person(s) Educated Patient    Methods Explanation;Demonstration;Tactile cues;Handout;Verbal cues    Comprehension Verbal cues  required;Returned demonstration;Verbalized understanding              OT Short Term Goals - 06/24/21 1147       OT SHORT TERM GOAL #1   Title Pt to be independent  in HEP to increase AROM in thumb and wrist to wean out of spilnt more than 75 % of time without increas symptoms    Status Achieved               OT Long Term Goals - 06/24/21 1147       OT LONG TERM GOAL #1   Title R wrist AROM increase to WNL for pt to push and pull door open without increase symptoms    Status Achieved      OT LONG TERM GOAL #2   Title R thumb AROM increase to WNL to retrieve objedt out of palm , hold glass and pick up 3 point with no increase symptoms    Status Achieved      OT LONG TERM GOAL #3   Title Pt R grip and prehension strength increase to more than 60% compare to L hand to carry plate, more than 5lbs and buttons with pain less than 2/10    Baseline R grip 40 , L 39 lbs - prehension  WNL for 3 point pinch -but lat grip still decrease but [facility]    Status Achieved                   Plan - 06/24/21 1141     Clinical Impression Statement Pt is  R thumb revision of CMC arthroplasty with tight rope tech on 04/02/21 by Dr Roland Rack - had previous [facility] arthroplasty about 16 yrs ago - pt now 12 wks s/p - R wrist AROM and strenght WNL and thumb AROM WFL. Pt's grip R 40 lbs , L 39 this date - prehension decrease but no pain. Pt was out of town for 2-3 wks- upgrade pt resistance for putty to med firm green - and pt to cont to increase reps nad sets as well as resistance for PA and  RA - pt can cont with HEP for another 3 wks -and then decrease - cont to increase functional use of R dominant hand without increase pain  Pt to follow up in 3 wks if needed otherwise will discharge him.    OT Occupational Profile and History Problem Focused Assessment - Including review of records relating to presenting problem    Occupational performance deficits (Please refer to evaluation for details):  ADL's;IADL's;Play;Leisure;Social Participation    Body Structure / Function / Physical Skills ADL;FMC;Decreased knowledge of precautions;Dexterity;Strength;Pain;UE functional use;IADL;ROM;Scar mobility;Flexibility    Rehab Potential Good    Clinical Decision Making Limited treatment options, no task modification necessary    Comorbidities Affecting Occupational Performance: None    Modification or Assistance to Complete Evaluation  No modification of tasks or assist necessary to complete eval    OT Frequency Monthly    OT Duration 4 weeks    OT Treatment/Interventions Self-care/ADL training;Fluidtherapy;DME and/or AE instruction;Splinting;Therapeutic activities;Scar mobilization;Paraffin;Manual Therapy;Patient/family education;Passive range of motion    Consulted and Agree with Plan of Care Patient             Patient will benefit from skilled therapeutic intervention in order to improve the following deficits and impairments:   Body Structure / Function / Physical Skills: ADL, FMC, Decreased knowledge of precautions, Dexterity, Strength, Pain, UE functional use, IADL, ROM, Scar mobility, Flexibility       Visit Diagnosis: Pain in right hand  Pain in right wrist  Stiffness of right hand, not elsewhere classified  Scar tissue  Stiffness of right wrist, not elsewhere classified    Problem List Patient Active Problem List   Diagnosis Date Noted   Migraine with aura 05/25/2014   Hx of adenomatous colonic polyps 11/01/2012   Routine general medical examination at a health care facility 10/09/2011   Allergic rhinitis due to pollen 10/09/2011   Hyperlipemia 11/06/2009   OSTEOARTHRITIS 11/06/2009    Rosalyn Gess, OTR/L,CLT 06/24/2021, 11:49 AM  [facility] [address] [place], [address] Phone: [phone]   Fax:  [phone]  Name: [name] MRN: [id number] Date of Birth:  [birth year]

## 2021-09-10 ENCOUNTER — Ambulatory Visit: Payer: Medicare Other | Admitting: Dermatology

## 2021-12-30 ENCOUNTER — Emergency Department: Payer: Medicare HMO

## 2021-12-30 ENCOUNTER — Encounter: Payer: Self-pay | Admitting: Emergency Medicine

## 2021-12-30 DIAGNOSIS — R002 Palpitations: Secondary | ICD-10-CM | POA: Diagnosis present

## 2021-12-30 DIAGNOSIS — Z79899 Other long term (current) drug therapy: Secondary | ICD-10-CM | POA: Diagnosis not present

## 2021-12-30 DIAGNOSIS — Z7901 Long term (current) use of anticoagulants: Secondary | ICD-10-CM | POA: Insufficient documentation

## 2021-12-30 DIAGNOSIS — I4891 Unspecified atrial fibrillation: Secondary | ICD-10-CM | POA: Insufficient documentation

## 2021-12-30 LAB — BASIC METABOLIC PANEL
Anion gap: 9 (ref 5–15)
BUN: 27 mg/dL — ABNORMAL HIGH (ref 8–23)
CO2: 27 mmol/L (ref 22–32)
Calcium: 9.5 mg/dL (ref 8.9–10.3)
Chloride: 101 mmol/L (ref 98–111)
Creatinine, Ser: 0.88 mg/dL (ref 0.44–1.00)
GFR, Estimated: >60 mL/min (ref >60)
Glucose, Bld: 179 mg/dL — ABNORMAL HIGH (ref 70–99)
Potassium: 4.1 mmol/L (ref 3.5–5.1)
Sodium: 137 mmol/L (ref 135–145)

## 2021-12-30 LAB — CBC
HCT: 43.3 % (ref 36.0–46.0)
Hemoglobin: 14.5 g/dL (ref 12.0–15.0)
MCH: 30.9 pg (ref 26.0–34.0)
MCHC: 33.5 g/dL (ref 30.0–36.0)
MCV: 92.3 fL (ref 80.0–100.0)
Platelets: 255 10*3/uL (ref 150–400)
RBC: 4.69 MIL/uL (ref 3.87–5.11)
RDW: 12.6 % (ref 11.5–15.5)
WBC: 10 10*3/uL (ref 4.0–10.5)
nRBC: 0 % (ref 0.0–0.2)

## 2021-12-30 LAB — PROTIME-INR
INR: 1.2 (ref 0.8–1.2)
Prothrombin Time: 14.7 seconds (ref 11.4–15.2)

## 2021-12-30 LAB — TROPONIN I (HIGH SENSITIVITY): Troponin I (High Sensitivity): 8 ng/L (ref <18)

## 2021-12-30 NOTE — ED Triage Notes (Signed)
Pt presents via POV with complaints of palpitations - Hx of afib and is compliant with her medication (Eliquis & Metoprolol). She endorses mild chest pain ~ 1 hour ago but it has subsided since her arrival. Denies SOB.  ?

## 2021-12-31 ENCOUNTER — Other Ambulatory Visit: Payer: Self-pay

## 2021-12-31 ENCOUNTER — Emergency Department
Admission: EM | Admit: 2021-12-31 | Discharge: 2021-12-31 | Disposition: A | Discharge status: Home / Self Care | Payer: Medicare HMO | Attending: Emergency Medicine | Admitting: Emergency Medicine

## 2021-12-31 DIAGNOSIS — I4891 Unspecified atrial fibrillation: Secondary | ICD-10-CM

## 2021-12-31 LAB — TROPONIN I (HIGH SENSITIVITY): Troponin I (High Sensitivity): 12 ng/L (ref <18)

## 2021-12-31 LAB — MAGNESIUM: Magnesium: 2.2 mg/dL (ref 1.7–2.4)

## 2021-12-31 LAB — TSH: TSH: 0.618 u[IU]/mL (ref 0.350–4.500)

## 2021-12-31 NOTE — ED Provider Notes (Signed)
? ?[facility] ?Provider Note ? ? ? Event Date/Time  ? First MD Initiated Contact with Patient 12/31/21 0119   ?  (approximate) ? ? ?History  ? ?Palpitations ? ? ?HPI ? ?[name] is a 71 y.o. female with history of atrial fibrillation on metoprolol and Eliquis, hyperlipidemia who presents to the emergency department with complaints of palpitations, left-sided chest pain that radiated into her jaw, shortness of breath, feeling weak and having "heaviness" in her legs.  Stated this started tonight while at rest several hours ago.  She states she was in atrial fibrillation with a heart rate in the 130s.  Has been compliant with her medications.  No fevers, cough, vomiting or diarrhea.  She is now back in normal sinus rhythm with a heart rate in the 50s to 60s and states she is now asymptomatic. ? ? ?History provided by patient and husband. ? ? ? ?Past Medical History:  ?Diagnosis Date  ? Adenomatous colon polyp   ? Allergy   ? SEASONAL  ? Complication of anesthesia   ? reports that she has given a medication that mades her relax but it make her anxious, requesting not to have this medication, does not know the name but it is given through the IV.  ? Depression   ? Dysrhythmia   ? AFIB  ? Gallstones   ? Hyperlipidemia   ? Osteoarthritis   ? ? ?Past Surgical History:  ?Procedure Laterality Date  ? APPENDECTOMY    ? BREAST ENHANCEMENT SURGERY    ? CARPOMETACARPAL ([facility]) FUSION OF THUMB Right 04/02/2021  ? Procedure: REVISION SUSPENSION ARTHROPLASTY OF RIGHT THUMB [facility] JOINT.;  Surgeon: Corky Mull, MD;  Location: [facility] ORS;  Service: Orthopedics;  Laterality: Right;  ? CATARACT EXTRACTION W/ INTRAOCULAR LENS  IMPLANT, BILATERAL  01/06/2015  ? CHOLECYSTECTOMY  09/07/1988  ? COLONOSCOPY    ? KNEE ARTHROSCOPY    ? right  ? partial vaginal hysterectomy    ? THUMB ARTHROSCOPY    ? bilateral  ? TONSILLECTOMY    ? ? ?MEDICATIONS:  ?Prior to Admission medications   ?Medication Sig Start Date End Date  Taking? Authorizing Provider  ?acetaminophen (TYLENOL) 500 MG tablet Take 500 mg by mouth every 6 (six) hours as needed (pain).    [provider]  ?apixaban (ELIQUIS) 5 MG TABS tablet Take 5 mg by mouth 2 (two) times daily.    [provider]  ?Biotin w/ Vitamins C & E (HAIR SKIN & NAILS GUMMIES PO) Take 3 tablets by mouth daily after supper. Vitafusion Hair, Skin & Nails Gummy    [provider]  ?cetirizine (ZYRTEC) 10 MG tablet Take 10 mg by mouth at bedtime.    [provider]  ?COLLAGEN PO Take 1 Scoop by mouth every evening. Multi Collagen Protein powder    [provider]  ?fluticasone (FLONASE) 50 MCG/ACT nasal spray USE 2 SPRAYS INTO EACH NOSTRIL ONCE DAILY ?Patient taking differently: Place 2 sprays into both nostrils daily as needed (seasonal allergies (SPRING MONTHS ONLY)). 06/04/15   Venia Carbon, MD  ?loperamide (IMODIUM A-D) 2 MG tablet Take 2-4 mg by mouth 4 (four) times daily as needed for diarrhea or loose stools.    [provider]  ?metoprolol tartrate (LOPRESSOR) 25 MG tablet Take 0.5 tablets (12.5 mg total) by mouth 2 (two) times daily for 14 days. 10/19/20 03/21/23  Vanessa Melvin, MD  ? ? ?Physical Exam  ? ?Triage Vital Signs: ?ED  Triage Vitals  ?Enc Vitals Group  ?   BP 12/30/21 2235 (!) 162/84  ?   Pulse Rate 12/30/21 2235 86  ?   Resp 12/30/21 2235 18  ?   Temp 12/30/21 2235 97.8 ?F (36.6 ?C)  ?   Temp Source 12/30/21 2235 Oral  ?   SpO2 12/30/21 2235 92 %  ?   Weight 12/30/21 2232 138 lb (62.6 kg)  ?   Height 12/30/21 2232 '5\' 7"'$  (1.702 m)  ?   Head Circumference --   ?   Peak Flow --   ?   Pain Score 12/30/21 2232 0  ?   Pain Loc --   ?   Pain Edu? --   ?   Excl. in [place]? --   ? ? ?Most recent vital signs: ?Vitals:  ? 12/30/21 2235 12/31/21 0211  ?BP: (!) 162/84 (!) 158/83  ?Pulse: 86 (!) 59  ?Resp: 18 16  ?Temp: 97.8 ?F (36.6 ?C)   ?SpO2: 92% 100%  ? ? ?CONSTITUTIONAL: Alert and oriented and responds appropriately to questions.  Well-appearing; well-nourished ?HEAD: Normocephalic, atraumatic ?EYES: Conjunctivae clear, pupils appear equal, sclera nonicteric ?ENT: normal nose; moist mucous membranes ?NECK: Supple, normal ROM ?CARD: RRR; S1 and S2 appreciated; no murmurs, no clicks, no rubs, no gallops ?RESP: Normal chest excursion without splinting or tachypnea; breath sounds clear and equal bilaterally; no wheezes, no rhonchi, no rales, no hypoxia or respiratory distress, speaking full sentences ?ABD/GI: Normal bowel sounds; non-distended; soft, non-tender, no rebound, no guarding, no peritoneal signs ?BACK: The back appears normal ?EXT: Normal ROM in all joints; no deformity noted, no edema; no cyanosis, no calf tenderness or calf swelling ?SKIN: Normal color for age and race; warm; no rash on exposed skin ?NEURO: Moves all extremities equally, normal speech ?PSYCH: The patient's mood and manner are appropriate. ? ? ?ED Results / Procedures / Treatments  ? ?LABS: ?(all labs ordered are listed, but only abnormal results are displayed) ?Labs Reviewed  ?BASIC METABOLIC PANEL - Abnormal; Notable for the following components:  ?    Result Value  ? Glucose, Bld 179 (*)   ? BUN 27 (*)   ? All other components within normal limits  ?CBC  ?PROTIME-INR  ?MAGNESIUM  ?TSH  ?TROPONIN I (HIGH SENSITIVITY)  ?TROPONIN I (HIGH SENSITIVITY)  ? ? ? ?EKG: ? EKG Interpretation ? ?Date/Time:  Tuesday December 30 2021 22:33:02 EDT ?Ventricular Rate:  101 ?PR Interval:    ?QRS Duration: 66 ?QT Interval:  298 ?QTC Calculation: 386 ?R Axis:   26 ?Text Interpretation: Atrial fibrillation with rapid ventricular response Low voltage QRS Cannot rule out Anteroseptal infarct (cited on or before 26-Mar-2021) Abnormal ECG When compared with ECG of 26-Mar-2021 14:21, Significant changes have occurred Confirmed by Pryor Curia [phone]) on 12/31/2021 2:25:09 AM ?  ? ?  ? ? ? ?RADIOLOGY: ?My personal review and interpretation of imaging: Chest x-ray clear. ? ?I have personally  reviewed all radiology reports.   ?DG Chest 2 View ? ?Result Date: 12/30/2021 ?CLINICAL DATA:  Chest pain EXAM: CHEST - 2 VIEW COMPARISON:  10/19/2020 FINDINGS: The heart size and mediastinal contours are within normal limits. Both lungs are clear. Mild scoliosis of the spine. IMPRESSION: No active cardiopulmonary disease. Electronically Signed   By: Donavan Foil M.D.   On: 12/30/2021 23:02   ? ? ?PROCEDURES: ? ?Critical Care performed: Yes, see critical care procedure note(s) ? ? ?CRITICAL CARE ?Performed by: Cyril Mourning Marcoantonio Legault ? ? ?Total critical care  time: 35 minutes ? ?Critical care time was exclusive of separately billable procedures and treating other patients. ? ?Critical care was necessary to treat or prevent imminent or life-threatening deterioration. ? ?Critical care was time spent personally by me on the following activities: development of treatment plan with patient and/or surrogate as well as nursing, discussions with consultants, evaluation of patient's response to treatment, examination of patient, obtaining history from patient or surrogate, ordering and performing treatments and interventions, ordering and review of laboratory studies, ordering and review of radiographic studies, pulse oximetry and re-evaluation of patient's condition. ? ? ?.1-3 Lead EKG Interpretation ?Performed by: Jermia Rigsby, Delice Bison, DO ?Authorized by: Marshelle Bilger, Delice Bison, DO  ? ?  Interpretation: normal   ?  ECG rate:  57 ?  ECG rate assessment: bradycardic   ?  Rhythm: sinus bradycardia   ?  Ectopy: none   ?  Conduction: normal   ? ? ? ?IMPRESSION / MDM / ASSESSMENT AND PLAN / ED COURSE  ?I reviewed the triage vital signs and the nursing notes. ? ? ? ?Patient here with chest pain, shortness of breath, weakness and A-fib with RVR that has spontaneously resolved. ? ?The patient is on the cardiac monitor to evaluate for evidence of arrhythmia and/or significant heart rate changes. ? ? ?DIFFERENTIAL DIAGNOSIS (includes but not limited to):    A-fib with RVR, ACS, anemia, electrolyte derangement, thyroid dysfunction, dehydration, PE, CHF ? ? ?PLAN: We will obtain CBC, BMP, magnesium level, troponin x2, TSH, chest x-ray.  Patient has spontaneously con

## 2021-12-31 NOTE — Discharge Instructions (Addendum)
I recommend close follow-up with your cardiologist for further recommendations. ?

## 2022-02-11 ENCOUNTER — Other Ambulatory Visit: Payer: Self-pay | Admitting: Physical Medicine & Rehabilitation

## 2022-02-11 DIAGNOSIS — G8929 Other chronic pain: Secondary | ICD-10-CM

## 2022-02-11 DIAGNOSIS — M542 Cervicalgia: Secondary | ICD-10-CM

## 2022-02-18 ENCOUNTER — Ambulatory Visit: Payer: Medicare HMO

## 2022-03-19 ENCOUNTER — Encounter: Payer: Self-pay | Admitting: Dermatology

## 2022-03-19 ENCOUNTER — Ambulatory Visit: Payer: Medicare HMO | Admitting: Dermatology

## 2022-03-19 DIAGNOSIS — L304 Erythema intertrigo: Secondary | ICD-10-CM | POA: Diagnosis not present

## 2022-03-19 MED ORDER — KETOCONAZOLE 2 % EX CREA: TOPICAL_CREAM | CUTANEOUS | 1 refills | Status: DC

## 2022-03-19 MED ORDER — HYDROCORTISONE 2.5 % EX CREA: TOPICAL_CREAM | CUTANEOUS | 1 refills | Status: DC

## 2022-03-19 NOTE — Patient Instructions (Signed)
Start Ketoconazole 2% cream twice daily until clear. Begin 2 days prior to hydrocortisone.   Start Hydrocortisone 2.5% cream twice daily up to 2 weeks as needed for itch.   Can use Zeasorb AF powder for maintenance/prevention.   Topical steroids (such as triamcinolone, fluocinolone, fluocinonide, mometasone, clobetasol, halobetasol, betamethasone, hydrocortisone) can cause thinning and lightening of the skin if they are used for too long in the same area. Your physician has selected the right strength medicine for your problem and area affected on the body. Please use your medication only as directed by your physician to prevent side effects.     Gentle Skin Care Guide  1. Bathe no more than once a day.  2. Avoid bathing in hot water  3. Use a mild soap like Dove, Vanicream, Cetaphil, CeraVe. Can use Lever 2000 or Cetaphil antibacterial soap  4. Use soap only where you need it. On most days, use it under your arms, between your legs, and on your feet. Let the water rinse other areas unless visibly dirty.  5. When you get out of the bath/shower, use a towel to gently blot your skin dry, don't rub it.  6. While your skin is still a little damp, apply a moisturizing cream such as Vanicream, CeraVe, Cetaphil, Eucerin, Sarna lotion or plain Vaseline Jelly. For hands apply Neutrogena [place] Hand Cream or Excipial Hand Cream.  7. Reapply moisturizer any time you start to itch or feel dry.  8. Sometimes using free and clear laundry detergents can be helpful. Fabric softener sheets should be avoided. Downy Free & Gentle liquid, or any liquid fabric softener that is free of dyes and perfumes, it acceptable to use  9. If your doctor has given you prescription creams you may apply moisturizers over them     Due to recent changes in healthcare laws, you may see results of your pathology and/or laboratory studies on MyChart before the doctors have had a chance to review them. We understand that  in some cases there may be results that are confusing or concerning to you. Please understand that not all results are received at the same time and often the doctors may need to interpret multiple results in order to provide you with the best plan of care or course of treatment. Therefore, we ask that you please give [place] 2 business days to thoroughly review all your results before contacting the office for clarification. Should we see a critical lab result, you will be contacted sooner.   If You Need Anything After Your Visit  If you have any questions or concerns for your doctor, please call our main line at [phone] and press option 4 to reach your doctor's medical assistant. If no one answers, please leave a voicemail as directed and we will return your call as soon as possible. Messages left after 4 pm will be answered the following business day.   You may also send [place] a message via [place]. We typically respond to MyChart messages within 1-2 business days.  For prescription refills, please ask your pharmacy to contact our office. Our fax number is [phone].  If you have an urgent issue when the clinic is closed that cannot wait until the next business day, you can page your doctor at the number below.    Please note that while we do our best to be available for urgent issues outside of office hours, we are not available 24/7.   If you have an urgent issue and are  unable to reach [place], you may choose to seek medical care at your doctor's office, retail clinic, urgent care center, or emergency room.  If you have a medical emergency, please immediately call 911 or go to the emergency department.  Pager Numbers  - Dr. Nehemiah Massed: [phone]  - Dr. Laurence Ferrari: [phone]  - Dr. Nicole Kindred: [phone]  In the event of inclement weather, please call our main line at [phone] for an update on the status of any delays or closures.  Dermatology Medication Tips: Please keep the boxes  that topical medications come in in order to help keep track of the instructions about where and how to use these. Pharmacies typically print the medication instructions only on the boxes and not directly on the medication tubes.   If your medication is too expensive, please contact our office at [phone] option 4 or send [place] a message through [place].   We are unable to tell what your co-pay for medications will be in advance as this is different depending on your insurance coverage. However, we may be able to find a substitute medication at lower cost or fill out paperwork to get insurance to cover a needed medication.   If a prior authorization is required to get your medication covered by your insurance company, please allow [place] 1-2 business days to complete this process.  Drug prices often vary depending on where the prescription is filled and some pharmacies may offer cheaper prices.  The website www.goodrx.com contains coupons for medications through different pharmacies. The prices here do not account for what the cost may be with help from insurance (it may be cheaper with your insurance), but the website can give you the price if you did not use any insurance.  - You can print the associated coupon and take it with your prescription to the pharmacy.  - You may also stop by our office during regular business hours and pick up a GoodRx coupon card.  - If you need your prescription sent electronically to a different pharmacy, notify our office through [facility] or by phone at [phone] option 4.     Si Usted Necesita Algo Despus de Su Visita  Tambin puede enviarnos un mensaje a travs de Pharmacist, community. Por lo general respondemos a los mensajes de MyChart en el transcurso de 1 a 2 das hbiles.  Para renovar recetas, por favor pida a su farmacia que se ponga en contacto con nuestra oficina. Harland Dingwall de fax es [place] [phone].  Si tiene un asunto urgente cuando la  clnica est cerrada y que no puede esperar hasta el siguiente da hbil, puede llamar/localizar a su doctor(a) al nmero que aparece a continuacin.   Por favor, tenga en cuenta que aunque hacemos todo lo posible para estar disponibles para asuntos urgentes fuera del horario de [place], no estamos disponibles las 24 horas del da, los 7 das de la [place].   Si tiene un problema urgente y no puede comunicarse con nosotros, puede optar por buscar atencin mdica  en el consultorio de su doctor(a), en una clnica privada, en un centro de atencin urgente o en una sala de emergencias.  Si tiene Engineering geologist, por favor llame inmediatamente al 911 o vaya a la sala de emergencias.  Nmeros de bper  - Dr. Nehemiah Massed: [phone]  - Dra. Moye: [phone]  - Dra. Nicole Kindred: [phone]  En caso de inclemencias del [place], por favor llame a Johnsie Kindred principal al [phone] para una actualizacin sobre el [place]  de cualquier retraso o cierre.  Consejos para la medicacin en dermatologa: Por favor, guarde las cajas en las que vienen los medicamentos de uso tpico para ayudarle a seguir las instrucciones sobre dnde y cmo usarlos. Las farmacias generalmente imprimen las instrucciones del medicamento slo en las cajas y no directamente en los tubos del [place].   Si su medicamento es muy caro, por favor, pngase en contacto con Zigmund Daniel llamando al [phone] y presione la opcin 4 o envenos un mensaje a travs de Pharmacist, community.   No podemos decirle cul ser su copago por los medicamentos por adelantado ya que esto es diferente dependiendo de la cobertura de su seguro. Sin embargo, es posible que podamos encontrar un medicamento sustituto a Electrical engineer un formulario para que el seguro cubra el medicamento que se considera necesario.   Si se requiere una autorizacin previa para que su compaa de seguros [place] su medicamento, por favor permtanos de 1 a 2 das hbiles  para completar este proceso.  Los precios de los medicamentos varan con frecuencia dependiendo del Environmental consultant de dnde se surte la receta y alguna farmacias pueden ofrecer precios ms baratos.  El sitio web www.goodrx.com tiene cupones para medicamentos de Airline pilot. Los precios aqu no tienen en cuenta lo que podra costar con la ayuda del seguro (puede ser ms barato con su seguro), pero el sitio web puede darle el precio si no utiliz Research scientist (physical sciences).  - Puede imprimir el cupn correspondiente y llevarlo con su receta a la farmacia.  - Tambin puede pasar por nuestra oficina durante el horario de atencin regular y Charity fundraiser una tarjeta de cupones de GoodRx.  - Si necesita que su receta se enve electrnicamente a una farmacia diferente, informe a nuestra oficina a travs de MyChart de [place] o por telfono llamando al [phone] y presione la opcin 4.

## 2022-03-19 NOTE — Progress Notes (Signed)
   Follow-Up Visit   Subjective  [name] is a 70 y.o. female who presents for the following: Rash (Dur: 2 days. Below left breast. Itches off and on throughout the day. No changes with soaps, lotions, laundry detergent. Thinks could be a heat rash, was working in the yard Tuesday morning. No treatment ).   The following portions of the chart were reviewed this encounter and updated as appropriate:  Tobacco  Allergies  Meds  Problems  Med Hx  Surg Hx  Fam Hx      Review of Systems: No other skin or systemic complaints except as noted in HPI or Assessment and Plan.   Objective  Well appearing patient in no apparent distress; mood and affect are within normal limits.  A focused examination was performed including chest. Relevant physical exam findings are noted in the Assessment and Plan.  Left Inframammary Fold Scaly pink papules coalescing to plaques   Assessment & Plan  Intertrigo Left Inframammary Fold  Vs candida  Start Ketoconazole 2% cream twice daily until clear. Begin 2 days prior to hydrocortisone.   Start Hydrocortisone 2.5% cream twice daily up to 2 weeks as needed for itch.  Can use Zeasorb AF powder for maintenance/prevention.  Topical steroids (such as triamcinolone, fluocinolone, fluocinonide, mometasone, clobetasol, halobetasol, betamethasone, hydrocortisone) can cause thinning and lightening of the skin if they are used for too long in the same area. Your physician has selected the right strength medicine for your problem and area affected on the body. Please use your medication only as directed by your physician to prevent side effects.    hydrocortisone 2.5 % cream - Left Inframammary Fold twice daily up to 2 weeks as needed for itch.  ketoconazole (NIZORAL) 2 % cream - Left Inframammary Fold Apply twice daily to affected area until clear   Return if symptoms worsen or fail to improve.  I, Emelia Salisbury, CMA, am acting as scribe for  Forest Gleason, MD.  Documentation: I have reviewed the above documentation for accuracy and completeness, and I agree with the above.  Forest Gleason, MD

## 2022-04-14 LAB — FECAL DNA COLORECTAL CANCER SCREENING (COLOGUARD): FIT-DNA (Cologuard): NEGATIVE

## 2022-04-14 LAB — COLOGUARD: COLOGUARD: NEGATIVE

## 2022-05-05 ENCOUNTER — Encounter (INDEPENDENT_AMBULATORY_CARE_PROVIDER_SITE_OTHER): Payer: Medicare HMO | Admitting: Ophthalmology

## 2022-05-05 DIAGNOSIS — D3132 Benign neoplasm of left choroid: Secondary | ICD-10-CM | POA: Diagnosis not present

## 2022-05-05 DIAGNOSIS — H43813 Vitreous degeneration, bilateral: Secondary | ICD-10-CM | POA: Diagnosis not present

## 2022-05-05 DIAGNOSIS — H33302 Unspecified retinal break, left eye: Secondary | ICD-10-CM

## 2022-05-05 DIAGNOSIS — H353122 Nonexudative age-related macular degeneration, left eye, intermediate dry stage: Secondary | ICD-10-CM | POA: Diagnosis not present

## 2022-05-05 DIAGNOSIS — H59031 Cystoid macular edema following cataract surgery, right eye: Secondary | ICD-10-CM | POA: Diagnosis not present

## 2022-05-05 DIAGNOSIS — H35371 Puckering of macula, right eye: Secondary | ICD-10-CM

## 2022-06-05 ENCOUNTER — Encounter (INDEPENDENT_AMBULATORY_CARE_PROVIDER_SITE_OTHER): Payer: Medicare HMO | Admitting: Ophthalmology

## 2022-06-05 DIAGNOSIS — H33302 Unspecified retinal break, left eye: Secondary | ICD-10-CM

## 2022-06-05 DIAGNOSIS — H35371 Puckering of macula, right eye: Secondary | ICD-10-CM | POA: Diagnosis not present

## 2022-06-05 DIAGNOSIS — H59031 Cystoid macular edema following cataract surgery, right eye: Secondary | ICD-10-CM | POA: Diagnosis not present

## 2022-06-05 DIAGNOSIS — H43813 Vitreous degeneration, bilateral: Secondary | ICD-10-CM | POA: Diagnosis not present

## 2022-06-05 DIAGNOSIS — H353122 Nonexudative age-related macular degeneration, left eye, intermediate dry stage: Secondary | ICD-10-CM | POA: Diagnosis not present

## 2022-06-05 DIAGNOSIS — D3132 Benign neoplasm of left choroid: Secondary | ICD-10-CM

## 2022-08-05 ENCOUNTER — Encounter (INDEPENDENT_AMBULATORY_CARE_PROVIDER_SITE_OTHER): Payer: Medicare HMO | Admitting: Ophthalmology

## 2022-08-05 DIAGNOSIS — H35371 Puckering of macula, right eye: Secondary | ICD-10-CM

## 2022-08-05 DIAGNOSIS — H43813 Vitreous degeneration, bilateral: Secondary | ICD-10-CM

## 2022-08-05 DIAGNOSIS — H59031 Cystoid macular edema following cataract surgery, right eye: Secondary | ICD-10-CM

## 2022-08-05 DIAGNOSIS — D3132 Benign neoplasm of left choroid: Secondary | ICD-10-CM | POA: Diagnosis not present

## 2022-08-05 DIAGNOSIS — H353122 Nonexudative age-related macular degeneration, left eye, intermediate dry stage: Secondary | ICD-10-CM

## 2022-08-05 DIAGNOSIS — H33302 Unspecified retinal break, left eye: Secondary | ICD-10-CM | POA: Diagnosis not present

## 2022-08-26 ENCOUNTER — Other Ambulatory Visit: Payer: Self-pay | Admitting: Student

## 2022-08-26 DIAGNOSIS — H532 Diplopia: Secondary | ICD-10-CM

## 2022-08-26 DIAGNOSIS — R519 Headache, unspecified: Secondary | ICD-10-CM

## 2022-09-08 ENCOUNTER — Ambulatory Visit
Admission: RE | Admit: 2022-09-08 | Discharge: 2022-09-08 | Disposition: A | Discharge status: Home / Self Care | Payer: Medicare HMO | Source: Ambulatory Visit | Attending: Student | Admitting: Student

## 2022-09-08 DIAGNOSIS — R519 Headache, unspecified: Secondary | ICD-10-CM | POA: Diagnosis present

## 2022-09-08 DIAGNOSIS — H532 Diplopia: Secondary | ICD-10-CM | POA: Insufficient documentation

## 2022-09-08 MED ORDER — GADOBUTROL 1 MMOL/ML IV SOLN
6.0000 mL | Freq: Once | INTRAVENOUS | Status: AC | PRN
  Administered 2022-09-08: 6 mL via INTRAVENOUS

## 2022-09-14 ENCOUNTER — Other Ambulatory Visit: Payer: Self-pay | Admitting: Surgery

## 2022-10-01 ENCOUNTER — Other Ambulatory Visit: Payer: Self-pay

## 2022-10-01 ENCOUNTER — Encounter
Admission: RE | Admit: 2022-10-01 | Discharge: 2022-10-01 | Disposition: A | Discharge status: Home / Self Care | Payer: Medicare HMO | Source: Ambulatory Visit | Attending: Surgery | Admitting: Surgery

## 2022-10-01 DIAGNOSIS — Z01812 Encounter for preprocedural laboratory examination: Secondary | ICD-10-CM

## 2022-10-01 HISTORY — DX: Pneumonia, unspecified organism: J18.9

## 2022-10-01 HISTORY — DX: Headache, unspecified: R51.9

## 2022-10-01 NOTE — Patient Instructions (Addendum)
Your procedure is scheduled on: 10/07/22 - Wednesday Report to the Registration Desk on the 1st floor of the [facility]. To find out your arrival time, please call [phone] between 1PM - 3PM on: 10/06/22 - Tuesday If your arrival time is 6:00 am, do not arrive prior to that time as the [facility] entrance doors do not open until 6:00 am.  REMEMBER: Instructions that are not followed completely may result in serious medical risk, up to and including death; or upon the discretion of your surgeon and anesthesiologist your surgery may need to be rescheduled.  Do not eat food after midnight the night before surgery.  No gum chewing, lozengers or hard candies.  You may however, drink CLEAR liquids up to 2 hours before you are scheduled to arrive for your surgery. Do not drink anything within 2 hours of your scheduled arrival time.  Clear liquids include: - water  - apple juice without pulp - gatorade (not RED colors) - black coffee or tea (Do NOT add milk or creamers to the coffee or tea) Do NOT drink anything that is not on this list.  In addition, your doctor has ordered for you to drink the provided  Ensure Pre-Surgery Clear Carbohydrate Drink  Drinking this carbohydrate drink up to two hours before surgery helps to reduce insulin resistance and improve patient outcomes. Please complete drinking 2 hours prior to scheduled arrival time.  TAKE THESE MEDICATIONS THE MORNING OF SURGERY WITH A SIP OF WATER:  - metoprolol tartrate (LOPRESSOR)   HOLD ELIQUIS beginning  10/05/22, may resume with doctor instructions.  One week prior to surgery: Stop Anti-inflammatories (NSAIDS) such as Advil, Aleve, Ibuprofen, Motrin, Naproxen, Naprosyn and Aspirin based products such as Excedrin, Goodys Powder, BC Powder.  Stop ANY OVER THE COUNTER supplements until after surgery.  You may however, continue to take Tylenol if needed for pain up until the day of surgery.  No Alcohol for 24 hours  before or after surgery.  No Smoking including e-cigarettes for 24 hours prior to surgery.  No chewable tobacco products for at least 6 hours prior to surgery.  No nicotine patches on the day of surgery.  Do not use any "recreational" drugs for at least a week prior to your surgery.  Please be advised that the combination of cocaine and anesthesia may have negative outcomes, up to and including death. If you test positive for cocaine, your surgery will be cancelled.  On the morning of surgery brush your teeth with toothpaste and water, you may rinse your mouth with mouthwash if you wish. Do not swallow any toothpaste or mouthwash.  Use CHG Soap or wipes as directed on instruction sheet.  Do not wear jewelry, make-up, hairpins, clips or nail polish.  Do not wear lotions, powders, or perfumes.   Do not shave body from the neck down 48 hours prior to surgery just in case you cut yourself which could leave a site for infection. Also, freshly shaved skin may become irritated if using the CHG soap.  Contact lenses, hearing aids and dentures may not be worn into surgery.  Do not bring valuables to the hospital. [facility] is not responsible for any missing/lost belongings or valuables.   Notify your doctor if there is any change in your medical condition (cold, fever, infection).  Wear comfortable clothing (specific to your surgery type) to the hospital.  After surgery, you can help prevent lung complications by doing breathing exercises.  Take deep breaths and cough  every 1-2 hours. Your doctor may order a device called an Incentive Spirometer to help you take deep breaths. When coughing or sneezing, hold a pillow firmly against your incision with both hands. This is called "splinting." Doing this helps protect your incision. It also decreases belly discomfort.  If you are being admitted to the hospital overnight, leave your suitcase in the car. After surgery it may be brought to your  room.  If you are being discharged the day of surgery, you will not be allowed to drive home. You will need a responsible adult (18 years or older) to drive you home and stay with you that night.   If you are taking public transportation, you will need to have a responsible adult (18 years or older) with you. Please confirm with your physician that it is acceptable to use public transportation.   Please call the [place] Dept. at [phone] if you have any questions about these instructions.  Surgery Visitation Policy:  Patients undergoing a surgery or procedure may have two family members or support persons with them as long as the person is not COVID-19 positive or experiencing its symptoms.   Inpatient Visitation:    Visiting hours are 7 a.m. to 8 p.m. Up to four visitors are allowed at one time in a patient room. The visitors may rotate out with other people during the day. One designated support person (adult) may remain overnight.  Due to an increase in RSV and influenza rates and associated hospitalizations, children ages 44 and under will not be able to visit patients in [facility]. Masks continue to be strongly recommended.

## 2022-10-02 ENCOUNTER — Encounter
Admission: RE | Admit: 2022-10-02 | Discharge: 2022-10-02 | Disposition: A | Discharge status: Home / Self Care | Payer: Medicare HMO | Source: Ambulatory Visit | Attending: Surgery | Admitting: Surgery

## 2022-10-02 DIAGNOSIS — Z01812 Encounter for preprocedural laboratory examination: Secondary | ICD-10-CM

## 2022-10-02 DIAGNOSIS — Z01810 Encounter for preprocedural cardiovascular examination: Secondary | ICD-10-CM | POA: Diagnosis not present

## 2022-10-02 LAB — CBC
HCT: 41.1 % (ref 36.0–46.0)
Hemoglobin: 14.2 g/dL (ref 12.0–15.0)
MCH: 31.8 pg (ref 26.0–34.0)
MCHC: 34.5 g/dL (ref 30.0–36.0)
MCV: 91.9 fL (ref 80.0–100.0)
Platelets: 194 10*3/uL (ref 150–400)
RBC: 4.47 MIL/uL (ref 3.87–5.11)
RDW: 11.9 % (ref 11.5–15.5)
WBC: 4.9 10*3/uL (ref 4.0–10.5)
nRBC: 0 % (ref 0.0–0.2)

## 2022-10-02 LAB — BASIC METABOLIC PANEL
Anion gap: 8 (ref 5–15)
BUN: 20 mg/dL (ref 8–23)
CO2: 26 mmol/L (ref 22–32)
Calcium: 8.9 mg/dL (ref 8.9–10.3)
Chloride: 105 mmol/L (ref 98–111)
Creatinine, Ser: 0.6 mg/dL (ref 0.44–1.00)
GFR, Estimated: >60 mL/min (ref >60)
Glucose, Bld: 87 mg/dL (ref 70–99)
Potassium: 4.1 mmol/L (ref 3.5–5.1)
Sodium: 139 mmol/L (ref 135–145)

## 2022-10-06 NOTE — Progress Notes (Signed)
The patient does not need this blood test for this case from an orthopedic standpoint.  However, I cannot comment whether she may needed from an anesthesia standpoint.  Thank you!

## 2022-10-07 ENCOUNTER — Encounter: Payer: Self-pay | Admitting: Surgery

## 2022-10-07 ENCOUNTER — Ambulatory Visit: Payer: Medicare HMO | Admitting: Anesthesiology

## 2022-10-07 ENCOUNTER — Ambulatory Visit
Admission: RE | Admit: 2022-10-07 | Discharge: 2022-10-07 | Disposition: A | Discharge status: Home / Self Care | Payer: Medicare HMO | Source: Ambulatory Visit | Attending: Surgery | Admitting: Surgery

## 2022-10-07 ENCOUNTER — Other Ambulatory Visit: Payer: Self-pay

## 2022-10-07 ENCOUNTER — Encounter
Admission: RE | Disposition: A | Discharge status: Home / Self Care | Payer: Self-pay | Source: Ambulatory Visit | Attending: Surgery

## 2022-10-07 ENCOUNTER — Ambulatory Visit: Payer: Medicare HMO | Admitting: Urgent Care

## 2022-10-07 DIAGNOSIS — S66313A Strain of extensor muscle, fascia and tendon of left middle finger at wrist and hand level, initial encounter: Secondary | ICD-10-CM | POA: Diagnosis present

## 2022-10-07 DIAGNOSIS — Z87891 Personal history of nicotine dependence: Secondary | ICD-10-CM | POA: Diagnosis not present

## 2022-10-07 DIAGNOSIS — X509XXA Other and unspecified overexertion or strenuous movements or postures, initial encounter: Secondary | ICD-10-CM | POA: Insufficient documentation

## 2022-10-07 HISTORY — PX: REPAIR EXTENSOR TENDON: SHX5382

## 2022-10-07 SURGERY — REPAIR, TENDON, EXTENSOR
Anesthesia: General | Site: Middle Finger | Laterality: Left

## 2022-10-07 MED ORDER — ONDANSETRON HCL 4 MG PO TABS: 4.0000 mg | ORAL_TABLET | Freq: Four times a day (QID) | ORAL | Status: DC | PRN

## 2022-10-07 MED ORDER — LACTATED RINGERS IV SOLN: INTRAVENOUS | Status: DC

## 2022-10-07 MED ORDER — ONDANSETRON HCL 4 MG/2ML IJ SOLN
INTRAMUSCULAR | Status: AC
  Filled 2022-10-07: qty 2

## 2022-10-07 MED ORDER — LIDOCAINE HCL (CARDIAC) PF 100 MG/5ML IV SOSY: PREFILLED_SYRINGE | INTRAVENOUS | Status: DC | PRN

## 2022-10-07 MED ORDER — ACETAMINOPHEN 325 MG PO TABS
325.0000 mg | ORAL_TABLET | Freq: Four times a day (QID) | ORAL | Status: DC | PRN
  Administered 2022-10-07: 650 mg via ORAL

## 2022-10-07 MED ORDER — METOCLOPRAMIDE HCL 10 MG PO TABS: 5.0000 mg | ORAL_TABLET | Freq: Three times a day (TID) | ORAL | Status: DC | PRN

## 2022-10-07 MED ORDER — FAMOTIDINE 20 MG PO TABS
ORAL_TABLET | ORAL | Status: AC
  Administered 2022-10-07: 20 mg via ORAL
  Filled 2022-10-07: qty 1

## 2022-10-07 MED ORDER — ONDANSETRON HCL 4 MG/2ML IJ SOLN
INTRAMUSCULAR | Status: DC | PRN
  Administered 2022-10-07: 4 mg via INTRAVENOUS

## 2022-10-07 MED ORDER — LACTATED RINGERS IV SOLN: INTRAVENOUS | Status: DC | PRN

## 2022-10-07 MED ORDER — ONDANSETRON HCL 4 MG/2ML IJ SOLN: 4.0000 mg | Freq: Four times a day (QID) | INTRAMUSCULAR | Status: DC | PRN

## 2022-10-07 MED ORDER — FENTANYL CITRATE (PF) 100 MCG/2ML IJ SOLN
INTRAMUSCULAR | Status: AC
  Filled 2022-10-07: qty 2

## 2022-10-07 MED ORDER — PROPOFOL 10 MG/ML IV BOLUS
INTRAVENOUS | Status: AC
  Filled 2022-10-07: qty 20

## 2022-10-07 MED ORDER — CHLORHEXIDINE GLUCONATE 0.12 % MT SOLN: 15.0000 mL | Freq: Once | OROMUCOSAL | Status: AC

## 2022-10-07 MED ORDER — PROPOFOL 10 MG/ML IV BOLUS
INTRAVENOUS | Status: DC | PRN
  Administered 2022-10-07 (×2): 50 mg via INTRAVENOUS
  Administered 2022-10-07: 100 mg via INTRAVENOUS

## 2022-10-07 MED ORDER — SODIUM CHLORIDE 0.9 % IV SOLN: INTRAVENOUS | Status: DC

## 2022-10-07 MED ORDER — METOCLOPRAMIDE HCL 5 MG/ML IJ SOLN: 5.0000 mg | Freq: Three times a day (TID) | INTRAMUSCULAR | Status: DC | PRN

## 2022-10-07 MED ORDER — ORAL CARE MOUTH RINSE: 15.0000 mL | Freq: Once | OROMUCOSAL | Status: AC

## 2022-10-07 MED ORDER — MIDAZOLAM HCL 2 MG/2ML IJ SOLN
INTRAMUSCULAR | Status: AC
  Filled 2022-10-07: qty 2

## 2022-10-07 MED ORDER — PHENYLEPHRINE 80 MCG/ML (10ML) SYRINGE FOR IV PUSH (FOR BLOOD PRESSURE SUPPORT)
PREFILLED_SYRINGE | INTRAVENOUS | Status: DC | PRN
  Administered 2022-10-07: 40 ug via INTRAVENOUS
  Administered 2022-10-07: 80 ug via INTRAVENOUS
  Administered 2022-10-07 (×2): 40 ug via INTRAVENOUS

## 2022-10-07 MED ORDER — FENTANYL CITRATE (PF) 100 MCG/2ML IJ SOLN: 25.0000 ug | INTRAMUSCULAR | Status: DC | PRN

## 2022-10-07 MED ORDER — FENTANYL CITRATE (PF) 100 MCG/2ML IJ SOLN
INTRAMUSCULAR | Status: DC | PRN
  Administered 2022-10-07 (×4): 25 ug via INTRAVENOUS

## 2022-10-07 MED ORDER — BUPIVACAINE HCL (PF) 0.5 % IJ SOLN
INTRAMUSCULAR | Status: AC
  Filled 2022-10-07: qty 30

## 2022-10-07 MED ORDER — LIDOCAINE HCL (PF) 2 % IJ SOLN
INTRAMUSCULAR | Status: AC
  Filled 2022-10-07: qty 5

## 2022-10-07 MED ORDER — FAMOTIDINE 20 MG PO TABS: 20.0000 mg | ORAL_TABLET | Freq: Once | ORAL | Status: AC

## 2022-10-07 MED ORDER — GLYCOPYRROLATE 0.2 MG/ML IJ SOLN
INTRAMUSCULAR | Status: DC | PRN
  Administered 2022-10-07: .1 mg via INTRAVENOUS

## 2022-10-07 MED ORDER — CEFAZOLIN SODIUM-DEXTROSE 2-4 GM/100ML-% IV SOLN
INTRAVENOUS | Status: AC
  Filled 2022-10-07: qty 100

## 2022-10-07 MED ORDER — ONDANSETRON HCL 4 MG/2ML IJ SOLN: 4.0000 mg | Freq: Once | INTRAMUSCULAR | Status: DC | PRN

## 2022-10-07 MED ORDER — CEFAZOLIN SODIUM-DEXTROSE 2-4 GM/100ML-% IV SOLN
2.0000 g | INTRAVENOUS | Status: AC
  Administered 2022-10-07: 2 g via INTRAVENOUS

## 2022-10-07 MED ORDER — 0.9 % SODIUM CHLORIDE (POUR BTL) OPTIME
TOPICAL | Status: DC | PRN
  Administered 2022-10-07: 500 mL

## 2022-10-07 MED ORDER — DEXAMETHASONE SODIUM PHOSPHATE 10 MG/ML IJ SOLN
INTRAMUSCULAR | Status: DC | PRN
  Administered 2022-10-07: 4 mg via INTRAVENOUS

## 2022-10-07 MED ORDER — CHLORHEXIDINE GLUCONATE 0.12 % MT SOLN
OROMUCOSAL | Status: AC
  Administered 2022-10-07: 15 mL via OROMUCOSAL
  Filled 2022-10-07: qty 15

## 2022-10-07 MED ORDER — DEXAMETHASONE SODIUM PHOSPHATE 10 MG/ML IJ SOLN
INTRAMUSCULAR | Status: AC
  Filled 2022-10-07: qty 1

## 2022-10-07 MED ORDER — EPHEDRINE SULFATE (PRESSORS) 50 MG/ML IJ SOLN
INTRAMUSCULAR | Status: DC | PRN
  Administered 2022-10-07 (×2): 5 mg via INTRAVENOUS

## 2022-10-07 MED ORDER — GLYCOPYRROLATE 0.2 MG/ML IJ SOLN
INTRAMUSCULAR | Status: AC
  Filled 2022-10-07: qty 1

## 2022-10-07 MED ORDER — ACETAMINOPHEN 325 MG PO TABS
ORAL_TABLET | ORAL | Status: AC
  Filled 2022-10-07: qty 2

## 2022-10-07 SURGICAL SUPPLY — 38 items
APL PRP STRL LF DISP 70% ISPRP (MISCELLANEOUS) ×1
BNDG CMPR STD VLCR NS LF 5.8X4 (GAUZE/BANDAGES/DRESSINGS) ×1
BNDG ELASTIC 4X5.8 VLCR NS LF (GAUZE/BANDAGES/DRESSINGS) ×1 IMPLANT
BNDG ESMARCH 4 X 12 STRL LF (GAUZE/BANDAGES/DRESSINGS) ×1
BNDG ESMARCH 4X12 STRL LF (GAUZE/BANDAGES/DRESSINGS) ×1 IMPLANT
CHLORAPREP W/TINT 26 (MISCELLANEOUS) ×1 IMPLANT
CORD BIP STRL DISP 12FT (MISCELLANEOUS) ×1 IMPLANT
ELECT CAUTERY BLADE 6.4 (BLADE) ×1 IMPLANT
ELECT REM PT RETURN 9FT ADLT (ELECTROSURGICAL) ×1
ELECTRODE REM PT RTRN 9FT ADLT (ELECTROSURGICAL) ×1 IMPLANT
FORCEPS JEWEL BIP 4-3/4 STR (INSTRUMENTS) ×1 IMPLANT
GAUZE SPONGE 4X4 12PLY STRL (GAUZE/BANDAGES/DRESSINGS) ×1 IMPLANT
GAUZE XEROFORM 1X8 LF (GAUZE/BANDAGES/DRESSINGS) ×1 IMPLANT
GLOVE BIO SURGEON STRL SZ8 (GLOVE) ×2 IMPLANT
GLOVE SURG UNDER LTX SZ8 (GLOVE) ×1 IMPLANT
GOWN STRL REUS W/ TWL LRG LVL3 (GOWN DISPOSABLE) ×1 IMPLANT
GOWN STRL REUS W/ TWL XL LVL3 (GOWN DISPOSABLE) ×1 IMPLANT
GOWN STRL REUS W/TWL LRG LVL3 (GOWN DISPOSABLE) ×1
GOWN STRL REUS W/TWL XL LVL3 (GOWN DISPOSABLE) ×1
KIT TURNOVER KIT A (KITS) ×1 IMPLANT
MANIFOLD NEPTUNE II (INSTRUMENTS) ×1 IMPLANT
NDL HYPO 25X1 1.5 SAFETY (NEEDLE) ×1 IMPLANT
NEEDLE HYPO 25X1 1.5 SAFETY (NEEDLE) ×1 IMPLANT
NS IRRIG 500ML POUR BTL (IV SOLUTION) ×1 IMPLANT
PACK EXTREMITY ARMC (MISCELLANEOUS) ×1 IMPLANT
PAD CAST 4YDX4 CTTN HI CHSV (CAST SUPPLIES) ×1 IMPLANT
PADDING CAST COTTON 4X4 STRL (CAST SUPPLIES) ×1
SPLINT CAST 1 STEP 4X15 (MISCELLANEOUS) ×1 IMPLANT
STOCKINETTE 48X4 2 PLY STRL (GAUZE/BANDAGES/DRESSINGS) ×1 IMPLANT
STOCKINETTE STRL 4IN 9604848 (GAUZE/BANDAGES/DRESSINGS) ×1 IMPLANT
SUT ETHILON 4 0 P 3 18 (SUTURE) ×1 IMPLANT
SUT ETHILON 5 0 P 3 18 (SUTURE) ×1
SUT MERSILENE 4-0 WHT RB-1 (SUTURE) ×1 IMPLANT
SUT NYLON ETHILON 5-0 P-3 1X18 (SUTURE) ×1 IMPLANT
SUT PROLENE 4 0 PS 2 18 (SUTURE) ×1 IMPLANT
SYR 10ML LL (SYRINGE) ×1 IMPLANT
TRAP FLUID SMOKE EVACUATOR (MISCELLANEOUS) ×1 IMPLANT
WATER STERILE IRR 500ML POUR (IV SOLUTION) ×1 IMPLANT

## 2022-10-07 NOTE — Discharge Instructions (Addendum)
Orthopedic discharge instructions: Keep dressing dry and intact. Keep hand elevated above heart level. May shower after dressing removed on postop day 4 (Sunday). Cover sutures with Band-Aids after drying off, then reapply Coban with gauze or tissue between fingers to keep index and long fingers together. Apply ice to affected area frequently. Resume Eliquis tomorrow morning. Take ES Tylenol when needed.  Return for follow-up in 10-14 days or as scheduled       .AMBULATORY SURGERY  DISCHARGE INSTRUCTIONS   The drugs that you were given will stay in your system until tomorrow so for the next 24 hours you should not:  Drive an automobile Make any legal decisions Drink any alcoholic beverage   You may resume regular meals tomorrow.  Today it is better to start with liquids and gradually work up to solid foods.  You may eat anything you prefer, but it is better to start with liquids, then soup and crackers, and gradually work up to solid foods.   Please notify your doctor immediately if you have any unusual bleeding, trouble breathing, redness and pain at the surgery site, drainage, fever, or pain not relieved by medication.     Your post-operative visit with Dr.                                       is: Date:                        Time:    Please call to schedule your post-operative visit.  Additional Instructions:

## 2022-10-07 NOTE — Anesthesia Postprocedure Evaluation (Signed)
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 (Left: Middle Finger)  Patient location during evaluation: PACU Anesthesia Type: General Level of consciousness: awake and awake and alert Pain management: pain level controlled Vital Signs Assessment: post-procedure vital signs reviewed and stable Respiratory status: spontaneous breathing and nonlabored ventilation Cardiovascular status: stable Anesthetic complications: no  No notable events documented.   Last Vitals:  Vitals:   10/07/22 1000 10/07/22 1010  BP: 122/76 (!) 144/78  Pulse: (!) 59 66  Resp: 15 20  Temp: (!) 36.1 C (!) 36.1 C  SpO2: 92% 98%    Last Pain:  Vitals:   10/07/22 1010  TempSrc: Temporal  PainSc: 0-No pain                 VAN STAVEREN,Bolden Hagerman

## 2022-10-07 NOTE — Anesthesia Procedure Notes (Signed)
Procedure Name: LMA Insertion Date/Time: 10/07/2022 8:39 AM  Performed by: Adalberto Ill, CRNAPre-anesthesia Checklist: Patient identified, Emergency Drugs available, Suction available, Timeout performed and Patient being monitored Patient Re-evaluated:Patient Re-evaluated prior to induction Oxygen Delivery Method: Circle system utilized Preoxygenation: Pre-oxygenation with 100% oxygen Induction Type: IV induction Ventilation: Mask ventilation without difficulty LMA Size: 4.0 Tube type: Oral Number of attempts: 1 (brief atraumatic) Placement Confirmation: positive ETCO2 and breath sounds checked- equal and bilateral ETT to lip (cm): yes. Tube secured with: Tape Dental Injury: Teeth and Oropharynx as per pre-operative assessment

## 2022-10-07 NOTE — Anesthesia Preprocedure Evaluation (Signed)
Anesthesia Evaluation  Patient identified by MRN, date of birth, ID band Patient awake    Reviewed: Allergy & Precautions, NPO status , Patient's Chart, lab work & pertinent test results  Airway Mallampati: II  TM Distance: >3 FB Neck ROM: full    Dental  (+) Teeth Intact   Pulmonary neg pulmonary ROS, former smoker   Pulmonary exam normal breath sounds clear to auscultation       Cardiovascular Exercise Tolerance: Good negative cardio ROS Normal cardiovascular exam Rhythm:Regular     Neuro/Psych  Headaches   Depression    negative neurological ROS  negative psych ROS   GI/Hepatic negative GI ROS, Neg liver ROS,,,  Endo/Other  negative endocrine ROS    Renal/GU negative Renal ROS  negative genitourinary   Musculoskeletal   Abdominal Normal abdominal exam  (+)   Peds negative pediatric ROS (+)  Hematology negative hematology ROS (+)   Anesthesia Other Findings Past Medical History: No date: Adenomatous colon polyp No date: Allergy     Comment:  SEASONAL No date: Complication of anesthesia     Comment:  reports that she has given a medication that mades her               relax but it make her anxious, requesting not to have               this medication, does not know the name but it is given               through the IV. No date: Depression No date: Dysrhythmia     Comment:  AFIB No date: Gallstones No date: Headache No date: Hyperlipidemia No date: Osteoarthritis No date: Pneumonia  Past Surgical History: No date: ABDOMINAL HYSTERECTOMY No date: APPENDECTOMY No date: BREAST ENHANCEMENT SURGERY 04/02/2021: CARPOMETACARPAL ([place]) FUSION OF THUMB; Right     Comment:  Procedure: REVISION SUSPENSION ARTHROPLASTY OF RIGHT               THUMB [facility] JOINT.;  Surgeon: Corky Mull, MD;  Location:              [facility] ORS;  Service: Orthopedics;  Laterality: Right; 01/06/2015: CATARACT EXTRACTION W/ INTRAOCULAR  LENS  IMPLANT,  BILATERAL 09/07/1988: CHOLECYSTECTOMY No date: COLONOSCOPY No date: KNEE ARTHROSCOPY     Comment:  right No date: partial vaginal hysterectomy No date: THUMB ARTHROSCOPY     Comment:  bilateral No date: TONSILLECTOMY     Reproductive/Obstetrics negative OB ROS                             Anesthesia Physical Anesthesia Plan  ASA: 2  Anesthesia Plan: General   Post-op Pain Management:    Induction: Intravenous  PONV Risk Score and Plan: 1 and Ondansetron and Dexamethasone  Airway Management Planned: LMA  Additional Equipment:   Intra-op Plan:   Post-operative Plan: Extubation in OR  Informed Consent: I have reviewed the patients History and Physical, chart, labs and discussed the procedure including the risks, benefits and alternatives for the proposed anesthesia with the patient or authorized representative who has indicated his/her understanding and acceptance.     Dental Advisory Given  Plan Discussed with: CRNA and Surgeon  Anesthesia Plan Comments:        Anesthesia Quick Evaluation

## 2022-10-07 NOTE — Op Note (Signed)
10/07/2022  9:46 AM  Patient:   [name]  Pre-Op Diagnosis:   Rupture of radial sagittal band, left long finger.  Post-Op Diagnosis:   Same  Procedure:   Primary repair of radial sagittal band, left long finger.  Surgeon:   Pascal Lux, MD  Assistant:   None  Anesthesia:   General LMA  Findings:   As above.  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 Her symptoms have persisted despite medications, activity modification, etc. Her history and examination are consistent with a rupture of the radial sagittal band. The patient presents at this time for repair of the radial sagittal band to the left long finger extensor tendon.  Procedure:   The patient was brought into the operating room and laid in the supine position.  After adequate general laryngeal mask anesthesia was obtained, the patient's left hand and upper extremity were prepped with ChloraPrep solution before being draped sterilely.  Preoperative antibiotics were administered.  A timeout was performed to verify the appropriate surgical site before the limb was exsanguinated with an Esmarch and the tourniquet inflated to 250 mmHg.  An approximately 2.5 to 3 cm curvilinear incision was made over the radial aspect of the MCP joint.  This incision was carried down through the subcutaneous tissues to expose the left long extensor tendon and radial sagittal band region.  The sagittal band was noted to have torn approximately 2 mm radial to the  extensor tendon.  The edges of the sagittal band tear were freshened with a #15 blade before the radial sagittal band was repaired primarily using several 4-0 Mersilene interrupted sutures.  Subsequently, the repair was assessed by gently flexing and extending the left long finger.  The tendon remained centralized over the metacarpal head.  The wound was copiously irrigated with sterile saline solution before the skin was closed using 4-0 Prolene interrupted sutures.  A sterile bulky dressing was applied to the wound with care taken to buddy tape of the index and long fingers.  The patient was then awakened, extubated, and returned to the recovery room in satisfactory condition after tolerating the procedure well.

## 2022-10-07 NOTE — H&P (Signed)
History of Present Illness: [name] is a 72 y.o. female who presents today for her surgical history and physical for upcoming primary repair versus reconstruction of the radial sagittal band of the left long finger. The patient is scheduled for surgery with Dr. Roland Rack on 10/07/2022. The patient denies any changes in her medical history since she was last evaluated. She denies any repeat trauma or injury affecting the left hand since her last evaluation with Dr. Roland Rack. The patient denies any personal history of heart attack or stroke, denies any history of asthma or COPD. The patient does have a history of A-fib, she does take chronic Eliquis. She has been instructed by her cardiologist to come off the Eliquis 2 days prior to surgery and resume after surgery. She denies any history of DVT. She denies any history of diabetes. She is still reporting a mechanical pain especially when she is trying to flex her fingers and make a full fist. She denies any numbness or tingling to the left upper extremity at this time.  Past Medical History: Allergic state  Arrhythmia  Colon polyp 08/09/2015  Depression 08/09/2015  Gall stones 08/09/2015  Generalized osteoarthritis 08/09/2015  Heart palpitations 08/09/2015  History of cataract 2015  had surgery both eyes 01/2015 cause - Prednisone  HLD (hyperlipidemia) 08/09/2015  IBS (irritable bowel syndrome) 08/12/2015  Low back pain 08/09/2015  Migraine with aura 08/09/2015  Paroxysmal atrial fibrillation (CMS-HCC) 08/12/2015   Past Surgical History: [place] ARTHROSCOPY Right [place]  COLONOSCOPY 12/03/2003 (Normal Colon)  EGD 06/05/2011 (Dr. Ulice Dash Pyrtle @ [facility] per patient)  COLONOSCOPY 06/05/2011 (Adenomatous Polyp - [place])  CATARACT EXTRACTION Bilateral 2016  COLONOSCOPY 08/04/2016  PH Adenomatous Polyp: [facility] 07/2021  Suspension arthroplasty  right thumb CMC joint Right 04/02/2021 (Dr. Roland Rack)  [place] REPLACEMENT Bilateral 2003 and 2005  trapeziectomy with FCR suspenstion  PHOTOREFRACTIVE KERATOTOMY/LASIK Left  TUBAL LIGATION   Past Family History: High blood pressure (Hypertension) Mother  Stroke Mother  Osteoporosis (Thinning of bones) Mother  Heart failure Father  High blood pressure (Hypertension) Father  Dementia Father  Diabetes type II Father  Myocardial Infarction (Heart attack) Father  Cancer Father  Arthritis Father  Alzheimer's disease Father  Coronary Artery Disease (Blocked arteries around heart) Father  Hyperlipidemia (Elevated cholesterol) Father  Osteoarthritis Father (hands)  No Known Problems Brother  Cancer Maternal Aunt  Cancer Maternal Uncle   Medications: acetaminophen (TYLENOL) 500 MG tablet Take 500 mg by mouth every 6 (six) hours as needed  COLLAGEN MISC Use 1 Tbsp once daily  ELIQUIS 5 mg tablet TAKE 1 TABLET BY MOUTH EVERY 12 HOURS 180 tablet 3  ketorolac (ACULAR) 0.5 % ophthalmic solution Place into the right eye 2 (two) times daily  metoprolol tartrate (LOPRESSOR) 25 MG tablet TAKE 0.5 TABLETS BY MOUTH 2 TIMES DAILY. 90 tablet 3   Allergies: Naproxen Headache  INFLUENZA vaccine 40+ yrs ago = chills, sweats  Feathers Other (Congestion)  Gluten Diarrhea and Other (Abdominal cramps)  Lidocaine Hives  Xylocaine = hives  Mobic [Meloxicam] Palpitations  Tramadol Other (Insomnia)   Review of Systems:  A comprehensive 14 point ROS was performed, reviewed by me today, and the pertinent orthopaedic findings are documented in the HPI.  Physical Exam: BP 122/76  Ht 170.2 cm ('5\' 7"'$ )  Wt 63.8 kg (140 lb 9.6 oz)  BMI 22.02 kg/m  General/Constitutional: The  patient appears to be well-nourished, well-developed, and in no acute distress. Neuro/Psych: Normal mood and affect, oriented to person, place and time. Eyes: Non-icteric. Pupils are equal, round, and  reactive to light, and exhibit synchronous movement. ENT: Unremarkable. Lymphatic: No palpable adenopathy. Respiratory: Lungs clear to auscultation, Normal chest excursion, No wheezes, and Non-labored breathing Cardiovascular: Regular rate and rhythm. No murmurs. and No edema, swelling or tenderness, except as noted in detailed exam. Integumentary: No impressive skin lesions present, except as noted in detailed exam. Musculoskeletal: Unremarkable, except as noted in detailed exam.  Left hand exam: Skin inspection of the left hand is unremarkable. No swelling, erythema, ecchymosis, abrasions, or other skin abnormalities are identified. There is at most minimal tenderness palpation of the dorsal aspect of the long MCP joint. The long extensor tendon appears to be somewhat ulnarly subluxed in a resting position. With active flexion of the long MCP joint, the tendon subluxes ulnarly further while causing slight ulnar deviation of the long finger at the MCP joint. She is able to actively flex and extend all digits without any pain or triggering. She is neurovascularly intact to all digits.  X-rays/MRI/Lab data:  Recent AP, lateral, and oblique views of the left hand are available for review and have been reviewed by myself. These films demonstrate no evidence of fractures, lytic lesions, or significant degenerative changes.  Impression: Radial sagittal band rupture MCP joint, left long finger.  Plan:  1. Treatment options were discussed today with the patient. 2. The patient is scheduled for a primary repair versus reconstruction of the radial sagittal band to the left long finger MCP joint. Surgery scheduled with Dr. Roland Rack on 10/07/2022. 3. The patient was instructed on the risk and benefits of surgical intervention and wishes to proceed at this time. 4. This document will serve as a surgical history and physical for the patient. 5. The patient will follow-up per standard postop protocol. They can  call the clinic they have any questions, new symptoms develop or symptoms worsen.  The procedure was discussed with the patient, as were the potential risks (including bleeding, infection, nerve and/or blood vessel injury, persistent or recurrent pain, failure of the repair, progression of arthritis, need for further surgery, blood clots, strokes, heart attacks and/or arhythmias, pneumonia, etc.) and benefits. The patient states her understanding and wishes to proceed.    H&P reviewed and patient re-examined. No changes.

## 2022-10-07 NOTE — Transfer of Care (Signed)
Immediate Anesthesia Transfer of Care Note  Patient: [name]  Procedure(s) Performed: 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. (Left: Middle Finger)  Patient Location: PACU  Anesthesia Type:General  Level of Consciousness: awake, alert , oriented, and patient cooperative  Airway & Oxygen Therapy: Patient Spontanous Breathing and Patient connected to face mask oxygen  Post-op Assessment: Report given to RN and Post -op Vital signs reviewed and stable  Post vital signs: Reviewed and stable  Last Vitals:  Vitals Value Taken Time  BP 114/70 10/07/22 0931  Temp    Pulse 76 10/07/22 0933  Resp 15 10/07/22 0933  SpO2 100 % 10/07/22 0933  Vitals shown include unvalidated device data.  Last Pain:  Vitals:   10/07/22 0732  TempSrc: Temporal  PainSc: 0-No pain         Complications: No notable events documented.

## 2022-11-05 ENCOUNTER — Encounter (INDEPENDENT_AMBULATORY_CARE_PROVIDER_SITE_OTHER): Payer: Medicare HMO | Admitting: Ophthalmology

## 2022-11-05 DIAGNOSIS — H43813 Vitreous degeneration, bilateral: Secondary | ICD-10-CM

## 2022-11-05 DIAGNOSIS — H59031 Cystoid macular edema following cataract surgery, right eye: Secondary | ICD-10-CM | POA: Diagnosis not present

## 2022-11-05 DIAGNOSIS — D3132 Benign neoplasm of left choroid: Secondary | ICD-10-CM | POA: Diagnosis not present

## 2022-11-05 DIAGNOSIS — H33302 Unspecified retinal break, left eye: Secondary | ICD-10-CM

## 2022-11-05 DIAGNOSIS — H35371 Puckering of macula, right eye: Secondary | ICD-10-CM | POA: Diagnosis not present

## 2022-11-05 DIAGNOSIS — H353122 Nonexudative age-related macular degeneration, left eye, intermediate dry stage: Secondary | ICD-10-CM

## 2022-11-10 ENCOUNTER — Ambulatory Visit: Payer: Medicare HMO | Attending: Student | Admitting: Occupational Therapy

## 2022-11-10 ENCOUNTER — Encounter: Payer: Self-pay | Admitting: Occupational Therapy

## 2022-11-10 DIAGNOSIS — L905 Scar conditions and fibrosis of skin: Secondary | ICD-10-CM | POA: Diagnosis present

## 2022-11-10 DIAGNOSIS — M25642 Stiffness of left hand, not elsewhere classified: Secondary | ICD-10-CM | POA: Diagnosis present

## 2022-11-10 DIAGNOSIS — M6281 Muscle weakness (generalized): Secondary | ICD-10-CM | POA: Diagnosis present

## 2022-11-10 NOTE — Therapy (Signed)
[facility] [address] [place], [address] Phone: [phone]   Fax:  [phone]  Occupational Therapy Evaluation  Patient Details  Name: [name] MRN: [id number] Date of Birth: 04/12/1951 Referring Provider (OT): Mikle Bosworth   Encounter Date: 11/10/2022   OT End of Session - 11/10/22 1254     Visit Number 1    Number of Visits 6    Date for OT Re-Evaluation 12/22/22    OT Start Time 1111    OT Stop Time 1149    OT Time Calculation (min) 38 min    Activity Tolerance Patient tolerated treatment well    Behavior During Therapy [facility] for tasks assessed/performed             Past Medical History:  Diagnosis Date   Adenomatous colon polyp    Allergy    SEASONAL   Complication of anesthesia    reports that she has given a medication that mades her relax but it make her anxious, requesting not to have this medication, does not know the name but it is given through the IV.   Depression    Dysrhythmia    AFIB   Gallstones    Headache    Hyperlipidemia    Osteoarthritis    Pneumonia     Past Surgical History:  Procedure Laterality Date   ABDOMINAL HYSTERECTOMY     APPENDECTOMY     BREAST ENHANCEMENT SURGERY     CARPOMETACARPAL (CMC) FUSION OF THUMB Right 04/02/2021   Procedure: REVISION SUSPENSION ARTHROPLASTY OF RIGHT THUMB [facility] JOINT.;  Surgeon: Corky Mull, MD;  Location: [facility] ORS;  Service: Orthopedics;  Laterality: Right;   CATARACT EXTRACTION W/ INTRAOCULAR LENS  IMPLANT, BILATERAL  01/06/2015   CHOLECYSTECTOMY  09/07/1988   COLONOSCOPY     KNEE ARTHROSCOPY     right   partial vaginal hysterectomy     REPAIR EXTENSOR TENDON Left 10/07/2022   Procedure: 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   Subjective Assessment - 11/10/22 1112     Subjective  Kept these 3 fingers taped together most of the time until yesterday - took the ring finger out - last night had some soreness in webspace -but today good    Pertinent History 10/19/22 ORTHO NOTE -[name] is a 72 y.o. female who presents today for her first postop appointment following a primary repair of the radial sagittal band involving the left long finger. Surgery was performed by Dr. Roland Rack on 10/07/2022. The patient feels that she is doing very well. She denies any numbness or tingling to the left upper extremity at today's visit. She denies any pain in the left hand at today's appointment. She denies any catching or locking symptoms when she has been performing gentle range of motion activities. She has been keeping the fingers buddy taped and has been avoiding doing heavy lifting and pushing with the left hand at this time. The patient presents today for suture removal and repeat evaluation. She is not taking anything for discomfort at this time. REfer to OT start first week of March    Patient Stated Goals Want to be able to use my L hand normally again with dog,  DIY work at home    Currently in Pain? No/denies               [facility] OT Assessment - 11/10/22 0001       Assessment   Medical Diagnosis L 3rd sagital band repair    Referring Provider (OT) Mikle Bosworth    Onset Date/Surgical Date 10/07/22    Hand Dominance Right    Next MD Visit 11/20/22    Prior Therapy 2022 R thumb [facility] arthroplasty      Home  Environment   Lives With Spouse      Prior Function   Vocation Retired    Leisure Dogs, read, some DIY projects around house , walk      Left Hand AROM   L Index  MCP 0-90 90 Degrees    L Index PIP 0-100 90 Degrees    L Long  MCP 0-90 90 Degrees    L Long PIP 0-100 90 Degrees    L Ring  MCP 0-90 90 Degrees    L Ring PIP 0-100 95 Degrees    L Little  MCP 0-90 90 Degrees    L Little PIP 0-100 95 Degrees                       OT Treatments/Exercises (OP) - 11/10/22 0001       LUE Fluidotherapy   Number Minutes Fluidotherapy 8 Minutes    LUE Fluidotherapy Location Hand    Comments AROM tendon glides prior to HEP             Reviewed with patient scar massage.  3 times a day for 2 to 3 minutes without irritating metacarpal. Cica -Care scar pad at nighttime. Fitted with a smaller buddy strap to wear during the day alternating between second and fourth only at proximal phalanges Kinesiotape etc. 100% pull over scar adhesion at third metacarpal to use during the day. Educated in precautions. Active range of motion tendon glides after heat if needed keeping it pain-free symptom-free 10 reps Tapping of digit extension 10 reps Avoid loaded lateral pinch as well as lateral loading on third digit like opening faucets, doorknobs, jars        OT Education - 11/10/22 1254     Education Details Findings of eval and HEP    Person(s) Educated Patient    Methods Explanation;Demonstration;Tactile cues;Handout;Verbal cues    Comprehension Verbal cues required;Returned demonstration;Verbalized understanding              OT Short Term Goals - 06/24/21 1147       OT SHORT TERM GOAL #1   Title Pt to be independent  in HEP to increase AROM in thumb and wrist to wean out of spilnt more than 75 % of time without increas symptoms    Status Achieved               OT Long Term Goals - 11/10/22 1300       OT LONG TERM GOAL #1   Title Patient to be independent in home program to decrease scar tissue in crease active of motion and lection extension with in normal limits with no increase in terms    Baseline Patient report yesterday some soreness in the webspace.  Some tightness in composite flexion.  Some scar adhesion with composite flexion and composite extension of  3rd digit -fitted with a smaller buddy strap at proximal phalanges    Time 2  Period Weeks    Status  New    Target Date 11/24/22      OT LONG TERM GOAL #2   Title Patient strength in left hand grip and prehension improved to within normal limits for her age to return to higher level of function with no increase symptoms    Baseline Initiate active range of motion today.  Patient just coming out of the strapping the whole third digit to second and fourth.  Plan to initiate strengthening next week.  Patient has some soreness yesterday reported in webspace    Time 6    Period Weeks    Status New    Target Date 12/22/22                   Plan - 11/10/22 1256     Clinical Impression Statement Patient presented OT evaluation tomorrow 6 weeks out from left third digit sagittal plane repair.  Patient arrive with wide [place] buddy strap on 2nd and 3rd digits.  Patient reports some stiffness in composite flexion.  Patient able to do tapping of digit in extension.  Do show small area of scar adhesion on dorsal 3rd MC.  Patient was educated in scar massage as well as kinesiotaping.  Fitted with a smaller buddy strap to alternate between second and fourth to decrease stiffness.  Patient tolerated treatment well patient to follow-up next week for initiating of strengthening.  Patient limited in functional use of left hand and ADLs and IADL.  Patient benefit from skilled OT services.    OT Occupational Profile and History Problem Focused Assessment - Including review of records relating to presenting problem    Occupational performance deficits (Please refer to evaluation for details): ADL's;IADL's;Play;Leisure;Social Participation    Body Structure / Function / Physical Skills ADL;Strength;UE functional use;IADL;ROM;Scar mobility;Flexibility    Rehab Potential Good    Clinical Decision Making Limited treatment options, no task modification necessary    Comorbidities Affecting Occupational Performance: None    Modification or Assistance to Complete Evaluation  No modification of tasks or assist  necessary to complete eval    OT Frequency --   1 x wk to biweekly dep on progress   OT Duration 6 weeks    OT Treatment/Interventions Self-care/ADL training;Fluidtherapy;Scar mobilization;Manual Therapy;Patient/family education;Contrast Bath;Therapeutic exercise    Consulted and Agree with Plan of Care Patient             Patient will benefit from skilled therapeutic intervention in order to improve the following deficits and impairments:   Body Structure / Function / Physical Skills: ADL, Strength, UE functional use, IADL, ROM, Scar mobility, Flexibility       Visit Diagnosis: Stiffness of left hand, not elsewhere classified  Muscle weakness (generalized)  Scar condition and fibrosis of skin    Problem List Patient Active Problem List   Diagnosis Date Noted   Migraine with aura 05/25/2014   Hx of adenomatous colonic polyps 11/01/2012   Routine general medical examination at a health care facility 10/09/2011   Allergic rhinitis due to pollen 10/09/2011   Hyperlipemia 11/06/2009   OSTEOARTHRITIS 11/06/2009    Rosalyn Gess, OTR/L,CLT 11/10/2022, 1:03 PM  [facility] [address], [place], [address] Phone: [phone]   Fax:  [phone]  Name: [name] MRN: [id number] Date of Birth: 04-27-1951

## 2022-11-16 IMAGING — CR DG CHEST 2V
2 series · 2 of 2 positions shown · non-contrast
Comparison: 10/19/2020

CLINICAL DATA: Chest pain

EXAM:
CHEST - 2 VIEW

[chest pa]
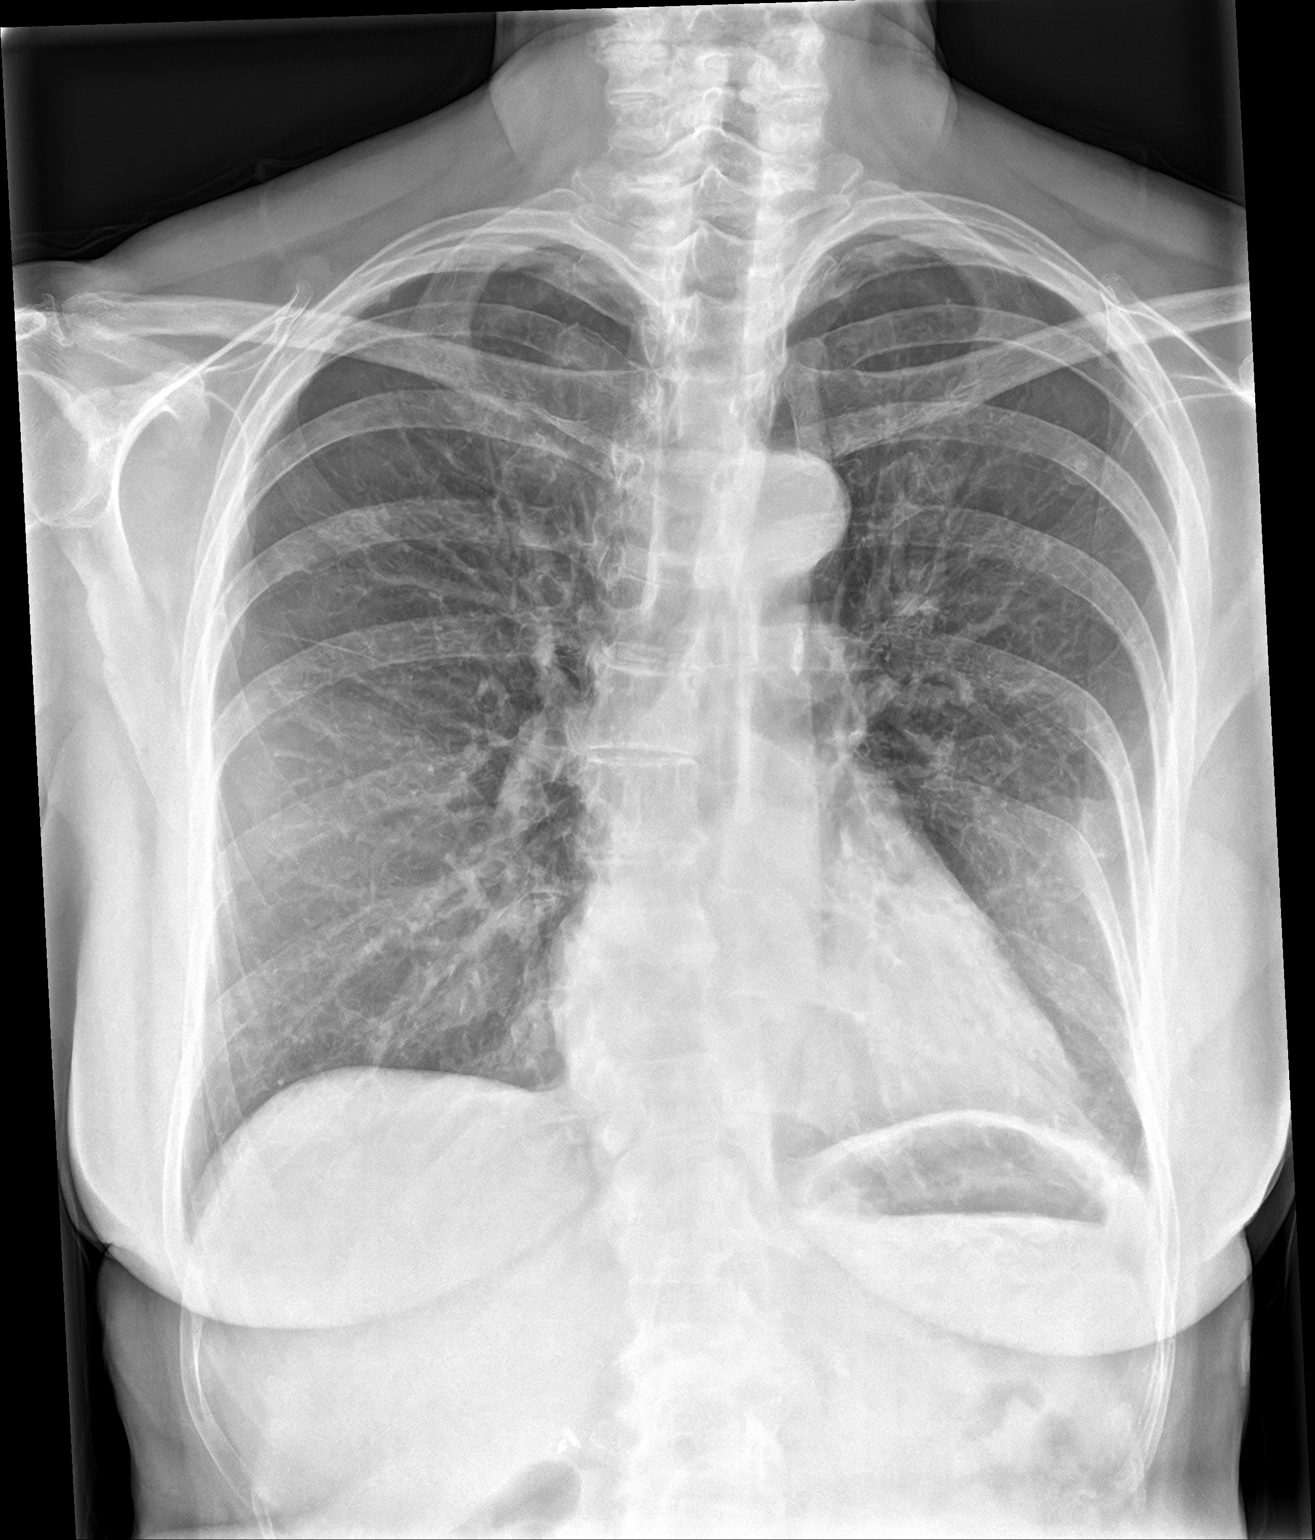

[chest lat]
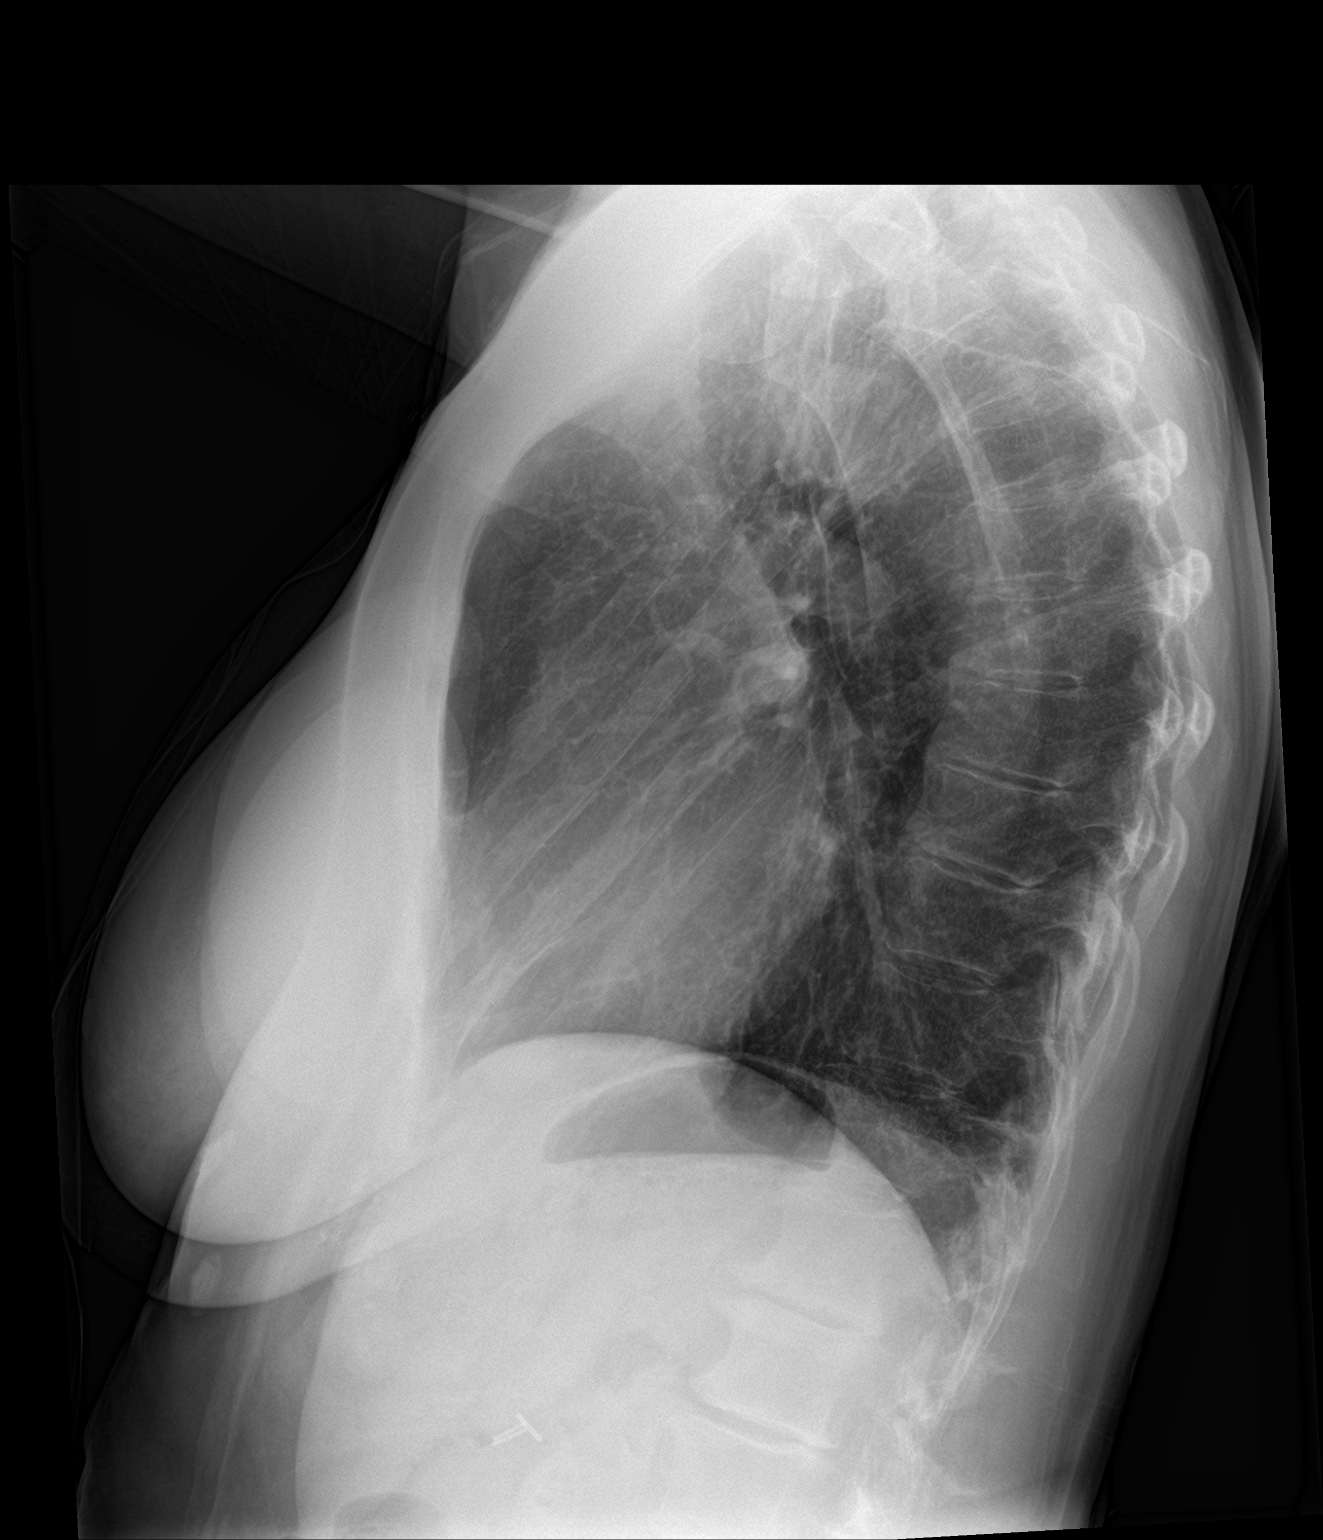

[2 of 2 positions shown; findings below may reference images not displayed]

FINDINGS: The heart size and mediastinal contours are within normal limits.
Both lungs are clear. Mild scoliosis of the spine.
IMPRESSION: No active cardiopulmonary disease.

## 2022-11-17 ENCOUNTER — Ambulatory Visit: Payer: Medicare HMO | Admitting: Occupational Therapy

## 2022-11-17 DIAGNOSIS — L905 Scar conditions and fibrosis of skin: Secondary | ICD-10-CM

## 2022-11-17 DIAGNOSIS — M6281 Muscle weakness (generalized): Secondary | ICD-10-CM

## 2022-11-17 DIAGNOSIS — M25642 Stiffness of left hand, not elsewhere classified: Secondary | ICD-10-CM

## 2022-11-17 NOTE — Therapy (Signed)
[facility] [address] [place], [address] Phone: [phone]   Fax:  [phone]  Occupational Therapy Treatment  Patient Details  Name: [name] MRN: [id number] Date of Birth: [birth year] Referring Provider (OT): Mikle Bosworth   Encounter Date: 11/17/2022   OT End of Session - 11/17/22 1957     Visit Number 2    Number of Visits 6    Date for OT Re-Evaluation 12/22/22    OT Start Time 1540    OT Stop Time 1613    OT Time Calculation (min) 33 min    Activity Tolerance Patient tolerated treatment well    Behavior During Therapy [facility] for tasks assessed/performed             Past Medical History:  Diagnosis Date   Adenomatous colon polyp    Allergy    SEASONAL   Complication of anesthesia    reports that she has given a medication that mades her relax but it make her anxious, requesting not to have this medication, does not know the name but it is given through the IV.   Depression    Dysrhythmia    AFIB   Gallstones    Headache    Hyperlipidemia    Osteoarthritis    Pneumonia     Past Surgical History:  Procedure Laterality Date   ABDOMINAL HYSTERECTOMY     APPENDECTOMY     BREAST ENHANCEMENT SURGERY     CARPOMETACARPAL (CMC) FUSION OF THUMB Right 04/02/2021   Procedure: REVISION SUSPENSION ARTHROPLASTY OF RIGHT THUMB [facility] JOINT.;  Surgeon: Corky Mull, MD;  Location: [facility] ORS;  Service: Orthopedics;  Laterality: Right;   CATARACT EXTRACTION W/ INTRAOCULAR LENS  IMPLANT, BILATERAL  01/06/2015   CHOLECYSTECTOMY  09/07/1988   COLONOSCOPY     KNEE ARTHROSCOPY     right   partial vaginal hysterectomy     REPAIR EXTENSOR TENDON Left 10/07/2022   Procedure: 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   Subjective Assessment - 11/17/22 1957     Subjective  Doing well - using my hand more - no pain really - scar moving better    Pertinent History 10/19/22 ORTHO NOTE -[name] is a 72 y.o. female who presents today for her first postop appointment following a primary repair of the radial sagittal band involving the left long finger. Surgery was performed by Dr. Roland Rack on 10/07/2022. The patient feels that she is doing very well. She denies any numbness or tingling to the left upper extremity at today's visit. She denies any pain in the left hand at today's appointment. She denies any catching or locking symptoms when she has been performing gentle range of motion activities. She has been keeping the fingers buddy taped and has been avoiding doing heavy lifting and pushing with the left hand at this time. The patient presents today for suture removal and repeat evaluation. She is not taking anything for discomfort at this time. REfer to OT start first week of March    Patient Stated Goals Want to be able to use my L hand normally again with dog, DIY work at home    Currently in Pain? No/denies  [facility] OT Assessment - 11/17/22 0001       Strength   Right Hand Grip (lbs) 41    Right Hand Lateral Pinch 7 lbs    Right Hand 3 Point Pinch 9 lbs    Left Hand Grip (lbs) 35    Left Hand Lateral Pinch 6 lbs    Left Hand 3 Point Pinch 9 lbs      Left Hand AROM   L Index  MCP 0-90 90 Degrees    L Index PIP 0-100 100 Degrees    L Long  MCP 0-90 90 Degrees    L Long PIP 0-100 100 Degrees    L Ring  MCP 0-90 90 Degrees    L Ring PIP 0-100 100 Degrees    L Little  MCP 0-90 90 Degrees    L Little PIP 0-100 100 Degrees               Pt AROM in L hand WNL - and tapping extention of digits too Scar adhesion improving -pt to cont with scar massage and cica scar pad night time   Pt to cont with  Active range of motion tendon glides after heat if needed keeping it  pain-free symptom-free 10 reps Tapping of digit extension 10 reps Light blue putty for 3 point pinch  No lat pinch yet or lat loading Grip pt can do with teal med putty -  15 reps- increase in 3 days if pain free  2nd set  2 x day               OT Education - 11/17/22 1957     Education Details progress and changes to HEP    Person(s) Educated Patient    Methods Explanation;Demonstration;Tactile cues;Handout;Verbal cues    Comprehension Verbal cues required;Returned demonstration;Verbalized understanding              OT Short Term Goals - 06/24/21 1147       OT SHORT TERM GOAL #1   Title Pt to be independent  in HEP to increase AROM in thumb and wrist to wean out of spilnt more than 75 % of time without increas symptoms    Status Achieved               OT Long Term Goals - 11/10/22 1300       OT LONG TERM GOAL #1   Title Patient to be independent in home program to decrease scar tissue in crease active of motion and lection extension with in normal limits with no increase in terms    Baseline Patient report yesterday some soreness in the webspace.  Some tightness in composite flexion.  Some scar adhesion with composite flexion and composite extension of  3rd digit -fitted with a smaller buddy strap at proximal phalanges    Time 2    Period Weeks    Status New    Target Date 11/24/22      OT LONG TERM GOAL #2   Title Patient strength in left hand grip and prehension improved to within normal limits for her age to return to higher level of function with no increase symptoms    Baseline Initiate active range of motion today.  Patient just coming out of the strapping the whole third digit to second and fourth.  Plan to initiate strengthening next week.  Patient has some soreness yesterday reported in webspace    Time 6    Period Weeks  Status New    Target Date 12/22/22                   Plan - 11/17/22 1958     Clinical Impression  Statement Patient presented at OT with diagnosis of L 3rd digit sagittal band repair.  Pt is about 7 wks s/p.  Pt made great progress to AROM WNL - scar adhesion better and no pain. Initiated strengthening for grip and 3 point pinch- but not lateral loading yet. Pt to wean out of buddy strap except if heavy activity with lateral loading. Pt to cont strengthening for week and follow up for 1- 2 more visits. Patient limited in functional use of left hand and ADLs and IADL.  Patient benefit from skilled OT services.    OT Occupational Profile and History Problem Focused Assessment - Including review of records relating to presenting problem    Occupational performance deficits (Please refer to evaluation for details): ADL's;IADL's;Play;Leisure;Social Participation    Body Structure / Function / Physical Skills ADL;Strength;UE functional use;IADL;ROM;Scar mobility;Flexibility    Rehab Potential Good    Clinical Decision Making Limited treatment options, no task modification necessary    Comorbidities Affecting Occupational Performance: None    Modification or Assistance to Complete Evaluation  No modification of tasks or assist necessary to complete eval    OT Frequency --   1 x wk to biweekly   OT Duration 6 weeks    OT Treatment/Interventions Self-care/ADL training;Fluidtherapy;Scar mobilization;Manual Therapy;Patient/family education;Contrast Bath;Therapeutic exercise    Consulted and Agree with Plan of Care Patient             Patient will benefit from skilled therapeutic intervention in order to improve the following deficits and impairments:   Body Structure / Function / Physical Skills: ADL, Strength, UE functional use, IADL, ROM, Scar mobility, Flexibility       Visit Diagnosis: Stiffness of left hand, not elsewhere classified  Muscle weakness (generalized)  Scar condition and fibrosis of skin    Problem List Patient Active Problem List   Diagnosis Date Noted   Migraine with  aura 05/25/2014   Hx of adenomatous colonic polyps 11/01/2012   Routine general medical examination at a health care facility 10/09/2011   Allergic rhinitis due to pollen 10/09/2011   Hyperlipemia 11/06/2009   OSTEOARTHRITIS 11/06/2009    Rosalyn Gess, OTR/L,CLT 11/17/2022, 8:04 PM  [facility] [address] [place], [address] Phone: [phone]   Fax:  [phone]  Name: [name] MRN: [id number] Date of Birth: Jun 27, 1951

## 2022-11-24 ENCOUNTER — Ambulatory Visit: Payer: Medicare HMO | Admitting: Occupational Therapy

## 2022-11-24 DIAGNOSIS — L905 Scar conditions and fibrosis of skin: Secondary | ICD-10-CM

## 2022-11-24 DIAGNOSIS — M6281 Muscle weakness (generalized): Secondary | ICD-10-CM

## 2022-11-24 DIAGNOSIS — M25642 Stiffness of left hand, not elsewhere classified: Secondary | ICD-10-CM

## 2022-11-24 NOTE — Therapy (Signed)
[facility] [address] [place], [address] Phone: [phone]   Fax:  [phone]  Occupational Therapy Treatment  Patient Details  Name: [name] MRN: [id number] Date of Birth: 1951/01/06 Referring Provider (OT): Mikle Bosworth   Encounter Date: 11/24/2022   OT End of Session - 11/24/22 1553     Visit Number 3    Number of Visits 6    Date for OT Re-Evaluation 12/22/22    OT Start Time 1530    OT Stop Time 1551    OT Time Calculation (min) 21 min    Activity Tolerance Patient tolerated treatment well    Behavior During Therapy [facility] for tasks assessed/performed             Past Medical History:  Diagnosis Date   Adenomatous colon polyp    Allergy    SEASONAL   Complication of anesthesia    reports that she has given a medication that mades her relax but it make her anxious, requesting not to have this medication, does not know the name but it is given through the IV.   Depression    Dysrhythmia    AFIB   Gallstones    Headache    Hyperlipidemia    Osteoarthritis    Pneumonia     Past Surgical History:  Procedure Laterality Date   ABDOMINAL HYSTERECTOMY     APPENDECTOMY     BREAST ENHANCEMENT SURGERY     CARPOMETACARPAL (CMC) FUSION OF THUMB Right 04/02/2021   Procedure: REVISION SUSPENSION ARTHROPLASTY OF RIGHT THUMB [facility] JOINT.;  Surgeon: Corky Mull, MD;  Location: [facility] ORS;  Service: Orthopedics;  Laterality: Right;   CATARACT EXTRACTION W/ INTRAOCULAR LENS  IMPLANT, BILATERAL  01/06/2015   CHOLECYSTECTOMY  09/07/1988   COLONOSCOPY     KNEE ARTHROSCOPY     right   partial vaginal hysterectomy     REPAIR EXTENSOR TENDON Left 10/07/2022   Procedure: 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   Subjective Assessment - 11/24/22 1552     Subjective  Some discomfort when I grip , pull something with resistance -I forget sometimes -but when I stop - it stops    Pertinent History 10/19/22 ORTHO NOTE -KIJUANA POITRA is a 72 y.o. female who presents today for her first postop appointment following a primary repair of the radial sagittal band involving the left long finger. Surgery was performed by Dr. Roland Rack on 10/07/2022. The patient feels that she is doing very well. She denies any numbness or tingling to the left upper extremity at today's visit. She denies any pain in the left hand at today's appointment. She denies any catching or locking symptoms when she has been performing gentle range of motion activities. She has been keeping the fingers buddy taped and has been avoiding doing heavy lifting and pushing with the left hand at this time. The patient presents today for suture removal and repeat evaluation. She is not taking anything for discomfort at this time. REfer to OT start first week of March    Patient Stated Goals Want to be able to use my L hand normally again with dog, DIY work at home    Currently in  Pain? No/denies                [facility] OT Assessment - 11/24/22 0001       Strength   Right Hand Grip (lbs) 41    Right Hand Lateral Pinch 7 lbs    Right Hand 3 Point Pinch 8 lbs    Left Hand Grip (lbs) 30    Left Hand Lateral Pinch 8 lbs    Left Hand 3 Point Pinch 8 lbs             AROM 90 at [facility] and PIP 100  Pt AROM in L hand WNL - and tapping extention of digits WNL Scar adhesion improving greatly- as well as not tenderness   Pt to cont with  Active range of motion tendon glides after heat if needed keeping it pain-free symptom-free 10 reps Tapping of digit extension 10 reps  Upgrade L hand to teal med putty for grip and 3 point pinch - pain free  12 reps -2 x day  Increase few days 2nd and 3rd set if painfree   No lat pinch yet or lateral  loading In 2 wks if pain and symptoms free can upgrade to green putty  Recommend enlarging her tools - insulation tubing - garden tools and house work grips to avoid tight and sustained grip at bother her at times  Can follow up if needed in month                  OT Education - 11/24/22 1553     Education Details progress and changes to HEP    Person(s) Educated Patient    Methods Explanation;Demonstration;Tactile cues;Handout;Verbal cues    Comprehension Verbal cues required;Returned demonstration;Verbalized understanding                 OT Long Term Goals - 11/10/22 1300       OT LONG TERM GOAL #1   Title Patient to be independent in home program to decrease scar tissue in crease active of motion and lection extension with in normal limits with no increase in terms    Baseline Patient report yesterday some soreness in the webspace.  Some tightness in composite flexion.  Some scar adhesion with composite flexion and composite extension of  3rd digit -fitted with a smaller buddy strap at proximal phalanges    Time 2    Period Weeks    Status New    Target Date 11/24/22      OT LONG TERM GOAL #2   Title Patient strength in left hand grip and prehension improved to within normal limits for her age to return to higher level of function with no increase symptoms    Baseline Initiate active range of motion today.  Patient just coming out of the strapping the whole third digit to second and fourth.  Plan to initiate strengthening next week.  Patient has some soreness yesterday reported in webspace    Time 6    Period Weeks    Status New    Target Date 12/22/22                   Plan - 11/24/22 1554     Clinical Impression Statement Patient presented at OT with diagnosis of L 3rd digit sagittal band repair.  Pt is about 8 wks s/p.  Pt made great progress to AROM WNL - scar adhesion better and no pain.  Pt  doing well with gripping  and 3 point pinch with  putty - pt still to avoid lateral pull or loading actvities for few wks. As well as to enlarge her grips on tools to avoid tight grip on cylinder objects for few wks. Pt to follow up with me in month if needed - gave her some HEP to upgrade to in 2 wks.    OT Occupational Profile and History Problem Focused Assessment - Including review of records relating to presenting problem    Occupational performance deficits (Please refer to evaluation for details): ADL's;IADL's;Play;Leisure;Social Participation    Body Structure / Function / Physical Skills ADL;Strength;UE functional use;IADL;ROM;Scar mobility;Flexibility    Rehab Potential Good    Clinical Decision Making Limited treatment options, no task modification necessary    Comorbidities Affecting Occupational Performance: None    Modification or Assistance to Complete Evaluation  No modification of tasks or assist necessary to complete eval    OT Frequency Monthly    OT Duration 4 weeks    OT Treatment/Interventions Self-care/ADL training;Fluidtherapy;Scar mobilization;Manual Therapy;Patient/family education;Contrast Bath;Therapeutic exercise    Consulted and Agree with Plan of Care Patient             Patient will benefit from skilled therapeutic intervention in order to improve the following deficits and impairments:   Body Structure / Function / Physical Skills: ADL, Strength, UE functional use, IADL, ROM, Scar mobility, Flexibility       Visit Diagnosis: Stiffness of left hand, not elsewhere classified  Muscle weakness (generalized)  Scar condition and fibrosis of skin    Problem List Patient Active Problem List   Diagnosis Date Noted   Migraine with aura 05/25/2014   Hx of adenomatous colonic polyps 11/01/2012   Routine general medical examination at a health care facility 10/09/2011   Allergic rhinitis due to pollen 10/09/2011   Hyperlipemia 11/06/2009   OSTEOARTHRITIS 11/06/2009    Rosalyn Gess,  OTR/L,CLT 11/24/2022, 3:56 PM  [facility] [address], [place], [address] Phone: [phone]   Fax:  [phone]  Name: [name] MRN: [id number] Date of Birth: 1951/04/03

## 2022-12-21 ENCOUNTER — Ambulatory Visit: Payer: Medicare HMO | Attending: Student | Admitting: Occupational Therapy

## 2022-12-21 ENCOUNTER — Encounter: Payer: Self-pay | Admitting: Occupational Therapy

## 2022-12-21 DIAGNOSIS — M6281 Muscle weakness (generalized): Secondary | ICD-10-CM | POA: Insufficient documentation

## 2022-12-21 DIAGNOSIS — L905 Scar conditions and fibrosis of skin: Secondary | ICD-10-CM | POA: Diagnosis present

## 2022-12-21 DIAGNOSIS — M25642 Stiffness of left hand, not elsewhere classified: Secondary | ICD-10-CM | POA: Insufficient documentation

## 2022-12-21 NOTE — Therapy (Signed)
[facility] [address], [place], [address] Phone: [phone]   Fax:  [phone]  Occupational Therapy Treatment/discharge  Patient Details  Name: [name] MRN: [id number] Date of Birth: September 09, 1950 Referring Provider (OT): Marney Doctor   Encounter Date: 12/21/2022   OT End of Session - 12/21/22 1508     Visit Number 4    Number of Visits 4    Date for OT Re-Evaluation 12/21/22    OT Start Time 1440    OT Stop Time 1500    OT Time Calculation (min) 20 min    Activity Tolerance Patient tolerated treatment well    Behavior During Therapy [facility] for tasks assessed/performed             Past Medical History:  Diagnosis Date   Adenomatous colon polyp    Allergy    SEASONAL   Complication of anesthesia    reports that she has given a medication that mades her relax but it make her anxious, requesting not to have this medication, does not know the name but it is given through the IV.   Depression    Dysrhythmia    AFIB   Gallstones    Headache    Hyperlipidemia    Osteoarthritis    Pneumonia     Past Surgical History:  Procedure Laterality Date   ABDOMINAL HYSTERECTOMY     APPENDECTOMY     BREAST ENHANCEMENT SURGERY     CARPOMETACARPAL (CMC) FUSION OF THUMB Right 04/02/2021   Procedure: REVISION SUSPENSION ARTHROPLASTY OF RIGHT THUMB [facility] JOINT.;  Surgeon: Christena Flake, MD;  Location: [facility] ORS;  Service: Orthopedics;  Laterality: Right;   CATARACT EXTRACTION W/ INTRAOCULAR LENS  IMPLANT, BILATERAL  01/06/2015   CHOLECYSTECTOMY  09/07/1988   COLONOSCOPY     KNEE ARTHROSCOPY     right   partial vaginal hysterectomy     REPAIR EXTENSOR TENDON Left 10/07/2022   Procedure: 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.;  Surgeon: Christena Flake, MD;  Location: [facility] ORS;  Service: Orthopedics;  Laterality: Left;   THUMB ARTHROSCOPY     bilateral   TONSILLECTOMY      There were no vitals  filed for this visit.   Subjective Assessment - 12/21/22 1506     Subjective  I am doing really okay.  I am back to working out at [employer].  Using my hands normally.  I did switch my ring so that they can because I felt some pain after I done something on the ring finger.  The middle finger was little tender.  Still has some stiffness in the morning    Pertinent History 10/19/22 ORTHO NOTE -[name] is a 72 y.o. female who presents today for her first postop appointment following a primary repair of the radial sagittal band involving the left long finger. Surgery was performed by Dr. Joice Lofts on 10/07/2022. The patient feels that she is doing very well. She denies any numbness or tingling to the left upper extremity at today's visit. She denies any pain in the left hand at today's appointment. She denies any catching or locking symptoms when she has been performing gentle range of motion activities. She has been keeping the fingers buddy taped and has been avoiding doing heavy lifting and pushing with the left hand at this time. The patient presents today for suture removal and repeat evaluation. She is not taking anything for discomfort at  this time. REfer to OT start first week of March    Patient Stated Goals Want to be able to use my L hand normally again with dog, DIY work at home    Currently in Pain? No/denies                [facility] OT Assessment - 12/21/22 0001       Strength   Right Hand Grip (lbs) 41    Right Hand Lateral Pinch 7 lbs    Right Hand 3 Point Pinch 9 lbs    Left Hand Grip (lbs) 32    Left Hand Lateral Pinch 8 lbs    Left Hand 3 Point Pinch 9 lbs      Left Hand AROM   L Long  MCP 0-90 90 Degrees    L Long PIP 0-100 100 Degrees    L Ring  MCP 0-90 90 Degrees    L Ring PIP 0-100 100 Degrees              Patient return after not being seen for a month.  Patient reported doing very well with home exercises and putty.   She also works out in the [employer]  doing [employer] and bands as well as bike.  Patient reports at times she cannot wear her ring-bothering the the middle finger.  Patient some tenderness at the proximal phalanges.  But improves. Upon further assessment it appear it is when she has increased activity the day or 2 before. Patient still reports some stiffness in the morning.  But reminded patient that it is okay that can be up to 6 months. Active range of motion within normal limits. Grip and prehension strength within normal limits compared to the right side. Patient did had a right CMC arthroplasty in the past Patient continue with green medium firm putty for gripping and prehension for another 2 weeks.  After that if she wants to she can continue 2 times a week.  Patient feels comfortable to be discharged.                OT Education - 12/21/22 1508     Education Details discharge instructions    Person(s) Educated Patient    Methods Explanation;Demonstration;Tactile cues;Handout;Verbal cues    Comprehension Verbal cues required;Returned demonstration;Verbalized understanding                 OT Long Term Goals - 12/21/22 1509       OT LONG TERM GOAL #1   Title Patient to be independent in home program to decrease scar tissue in crease active of motion and lection extension with in normal limits with no increase in terms    Status Achieved      OT LONG TERM GOAL #2   Title Patient strength in left hand grip and prehension improved to within normal limits for her age to return to higher level of function with no increase symptoms    Status Achieved      OT LONG TERM GOAL #3   Title Pt R grip and prehension strength increase to more than 60% compare to L hand to carry plate, more than 5lbs and buttons with pain less than 2/10    Status Achieved                   Plan - 12/21/22 1509     Clinical Impression Statement Patient presented at OT with diagnosis of L 3rd digit sagittal band  repair.  Pt is about 2 1/2 months s/p.  Pt made great progress to AROM WNL - scar adhesion better and no pain.  Pt' grip and prehension strenght WNL compare to R hand and return to prior level function - still at times feels little tenderness over 3rd prox phalanges with wearing ring and stiffness in the am - but otherwise no issues. Pt to cont with HEP for 2 more wks and then can cont if want 2 x wk to maintain . Pt met all goals and discharge from OT services.    OT Occupational Profile and History Problem Focused Assessment - Including review of records relating to presenting problem    Occupational performance deficits (Please refer to evaluation for details): ADL's;IADL's;Play;Leisure;Social Participation    Body Structure / Function / Physical Skills ADL;Strength;UE functional use;IADL;ROM;Scar mobility;Flexibility    Rehab Potential Good    Clinical Decision Making Limited treatment options, no task modification necessary    Comorbidities Affecting Occupational Performance: None    Modification or Assistance to Complete Evaluation  No modification of tasks or assist necessary to complete eval    OT Treatment/Interventions Self-care/ADL training;Fluidtherapy;Scar mobilization;Manual Therapy;Patient/family education;Contrast Bath;Therapeutic exercise    Consulted and Agree with Plan of Care Patient             Patient will benefit from skilled therapeutic intervention in order to improve the following deficits and impairments:   Body Structure / Function / Physical Skills: ADL, Strength, UE functional use, IADL, ROM, Scar mobility, Flexibility       Visit Diagnosis: Stiffness of left hand, not elsewhere classified  Muscle weakness (generalized)  Scar condition and fibrosis of skin    Problem List Patient Active Problem List   Diagnosis Date Noted   Migraine with aura 05/25/2014   Hx of adenomatous colonic polyps 11/01/2012   Routine general medical examination at a health  care facility 10/09/2011   Allergic rhinitis due to pollen 10/09/2011   Hyperlipemia 11/06/2009   OSTEOARTHRITIS 11/06/2009    Oletta Cohn, OTR/L,CLT 12/21/2022, 3:12 PM  [facility] [address], [place], [address] Phone: [phone]   Fax:  [phone]  Name: [name] MRN: [id number] Date of Birth: [birth year]

## 2022-12-21 NOTE — Unmapped (Signed)
Formatting of this note is different from the original.  [facility]  [facility]  [address], [place], [address]  Phone: [phone]   Fax:  [phone]    Occupational Therapy Treatment/discharge    Patient Details   Name: Pamela Frost  MRN: [id number]  Date of Birth: [birth year]  Referring Provider (OT): Marney Doctor    Encounter Date: 12/21/2022     OT End of Session - 12/21/22 1508       Visit Number 4     Number of Visits 4     Date for OT Re-Evaluation 12/21/22     OT Start Time 1440     OT Stop Time 1500     OT Time Calculation (min) 20 min     Activity Tolerance Patient tolerated treatment well     Behavior During Therapy [facility] for tasks assessed/performed                Past Medical History:   Diagnosis Date    Adenomatous colon polyp     Allergy     SEASONAL    Complication of anesthesia     reports that she has given a medication that mades her relax but it make her anxious, requesting not to have this medication, does not know the name but it is given through the IV.    Depression     Dysrhythmia     AFIB    Gallstones     Headache     Hyperlipidemia     Osteoarthritis     Pneumonia      Past Surgical History:   Procedure Laterality Date    ABDOMINAL HYSTERECTOMY      APPENDECTOMY      BREAST ENHANCEMENT SURGERY      CARPOMETACARPAL (CMC) FUSION OF THUMB Right 04/02/2021    Procedure: REVISION SUSPENSION ARTHROPLASTY OF RIGHT THUMB [facility] JOINT.;  Surgeon: Christena Flake, MD;  Location: [facility] ORS;  Service: Orthopedics;  Laterality: Right;    CATARACT EXTRACTION W/ INTRAOCULAR LENS  IMPLANT, BILATERAL  01/06/2015    CHOLECYSTECTOMY  09/07/1988    COLONOSCOPY      KNEE ARTHROSCOPY      right    partial vaginal hysterectomy      REPAIR EXTENSOR TENDON Left 10/07/2022    Procedure: 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     Subjective Assessment - 12/21/22 1506       Subjective  I am doing really okay.  I am back to working out at [employer].  Using my hands normally.  I did switch my ring so that they can because I felt some pain after I done something on the ring finger.  The middle finger was little tender.  Still has some stiffness in the morning     Pertinent History 10/19/22 ORTHO NOTE -Pamela Frost is a 72 y.o. female who presents today for her first postop appointment following a primary repair of the radial sagittal band involving the left long finger. Surgery was performed by Dr. Joice Lofts on 10/07/2022. The patient feels that she is doing very well. She denies any numbness or tingling to the left  upper extremity at today's visit. She denies any pain in the left hand at today's appointment. She denies any catching or locking symptoms when she has been performing gentle range of motion activities. She has been keeping the fingers buddy taped and has been avoiding doing heavy lifting and pushing with the left hand at this time. The patient presents today for suture removal and repeat evaluation. She is not taking anything for discomfort at this time. REfer to OT start first week of March     Patient Stated Goals Want to be able to use my L hand normally again with dog, DIY work at home     Currently in Pain? No/denies                 [facility] OT Assessment - 12/21/22 0001         Strength    Right Hand Grip (lbs) 41     Right Hand Lateral Pinch 7 lbs     Right Hand 3 Point Pinch 9 lbs     Left Hand Grip (lbs) 32     Left Hand Lateral Pinch 8 lbs     Left Hand 3 Point Pinch 9 lbs       Left Hand AROM    L Long  MCP 0-90 90 Degrees     L Long PIP 0-100 100 Degrees     L Ring  MCP 0-90 90 Degrees     L Ring PIP 0-100 100 Degrees                Patient return after not being seen for a month.  Patient reported doing very well with home exercises and putty.    She also works  out in the [employer] doing [employer] and bands as well as bike.    Patient reports at times she cannot wear her ring-bothering the the middle finger.  Patient some tenderness at the proximal phalanges.  But improves.  Upon further assessment it appear it is when she has increased activity the day or 2 before.  Patient still reports some stiffness in the morning.  But reminded patient that it is okay that can be up to 6 months.  Active range of motion within normal limits.  Grip and prehension strength within normal limits compared to the right side.  Patient did had a right CMC arthroplasty in the past  Patient continue with green medium firm putty for gripping and prehension for another 2 weeks.  After that if she wants to she can continue 2 times a week.  Patient feels comfortable to be discharged.     OT Education - 12/21/22 1508       Education Details discharge instructions     Person(s) Educated Patient     Methods Explanation;Demonstration;Tactile cues;Handout;Verbal cues     Comprehension Verbal cues required;Returned demonstration;Verbalized understanding                 OT Long Term Goals - 12/21/22 1509         OT LONG TERM GOAL #1    Title Patient to be independent in home program to decrease scar tissue in crease active of motion and lection extension with in normal limits with no increase in terms     Status Achieved       OT LONG TERM GOAL #2    Title Patient strength in left hand grip and prehension improved to within normal limits for her age to  return to higher level of function with no increase symptoms     Status Achieved       OT LONG TERM GOAL #3    Title Pt R grip and prehension strength increase to more than 60% compare to L hand to carry plate, more than 5lbs and buttons with pain less than 2/10     Status Achieved                 Plan - 12/21/22 1509       Clinical Impression Statement Patient presented at OT with diagnosis of L 3rd digit sagittal band repair.  Pt is about 2 1/2 months s/p.   Pt made great progress to AROM WNL - scar adhesion better and no pain.  Pt' grip and prehension strenght WNL compare to R hand and return to prior level function - still at times feels little tenderness over 3rd prox phalanges with wearing ring and stiffness in the am - but otherwise no issues. Pt to cont with HEP for 2 more wks and then can cont if want 2 x wk to maintain . Pt met all goals and discharge from OT services.     OT Occupational Profile and History Problem Focused Assessment - Including review of records relating to presenting problem     Occupational performance deficits (Please refer to evaluation for details): ADL's;IADL's;Play;Leisure;Social Participation     Body Structure / Function / Physical Skills ADL;Strength;UE functional use;IADL;ROM;Scar mobility;Flexibility     Rehab Potential Good     Clinical Decision Making Limited treatment options, no task modification necessary     Comorbidities Affecting Occupational Performance: None     Modification or Assistance to Complete Evaluation  No modification of tasks or assist necessary to complete eval     OT Treatment/Interventions Self-care/ADL training;Fluidtherapy;Scar mobilization;Manual Therapy;Patient/family education;Contrast Bath;Therapeutic exercise     Consulted and Agree with Plan of Care Patient                Patient will benefit from skilled therapeutic intervention in order to improve the following deficits and impairments:    Body Structure / Function / Physical Skills: ADL, Strength, UE functional use, IADL, ROM, Scar mobility, Flexibility        Visit Diagnosis:  Stiffness of left hand, not elsewhere classified    Muscle weakness (generalized)    Scar condition and fibrosis of skin    Problem List  Patient Active Problem List    Diagnosis Date Noted    Migraine with aura 05/25/2014    Hx of adenomatous colonic polyps 11/01/2012    Routine general medical examination at a health care facility 10/09/2011    Allergic rhinitis due to  pollen 10/09/2011    Hyperlipemia 11/06/2009    OSTEOARTHRITIS 11/06/2009     Oletta Cohn, OTR/L,CLT  12/21/2022, 3:12 PM    [facility]  [facility]  [address] [place], [address]  Phone: [phone]   Fax:  [phone]    Name: MAALI STOLZE  MRN: [id number]  Date of Birth: 1951/04/29    Electronically signed by Oletta Cohn, OT at 12/21/2022  3:16 PM EDT

## 2023-01-15 ENCOUNTER — Emergency Department: Admit: 2023-01-16 | Payer: MEDICARE

## 2023-01-15 ENCOUNTER — Inpatient Hospital Stay
Admit: 2023-01-15 | Discharge: 2023-01-16 | Disposition: A | Discharge status: Home / Self Care | Payer: MEDICARE | Attending: Emergency Medicine

## 2023-01-15 DIAGNOSIS — M25561 Pain in right knee: Secondary | ICD-10-CM

## 2023-01-15 NOTE — Discharge Instructions (Signed)
Please return to the emergency room with any acute questions, concerns, worsening status or any delay to your outpatient care.    Thank you so much for visiting with us today.  You have had a screening emergency medical exam and to this point, no emergent condition has been identified.  It is important to realize that of course your visit today is simply a moment in time and that your medical condition can certainly change and become worse, necessitating re-evaluation in an urgent or emergent manner.  It is your responsibility to seek care in such instances.  It is also your responsibility to follow up with your primary care provider to discuss your Emergency Department visit and to go over any and all testing that was incurred during your Emergency Department visit.   We have made you aware of any pertinent results at this visit, however this may not have been comprehensive.  Lack of an acute emergency condition does not mean that there is not a problem, nor does it replace a thorough exam performed by a primary care physician.  It is your responsibility to follow your discharge instructions as given, to follow up with any other physicians as indicated, and to return to the Emergency Department as instructed.  Thank you again for visiting with us today, please let us know if you have any further questions.     You may also call [phone] for referral to physicians in your area.

## 2023-01-15 NOTE — ED Provider Notes (Signed)
[facility] EMERGENCY DEPT  EMERGENCY DEPARTMENT ENCOUNTER      Pt Name: Pamela Frost  MRN: [id number]  Birthdate Jul 05, 1951  Date of evaluation: 01/15/2023  Provider: Brendolyn Patty, MD  Provider evaluation time: 01/15/23 2145    CHIEF COMPLAINT       Chief Complaint   Patient presents with    Knee Pain     Pt complaining of right knee pain and swelling after walking up stairs. Pt states pain is worse when bending her knee. Ambulatory to triage.          HISTORY OF PRESENT ILLNESS    HPI  72 y.o. female presenting with complaint of knee pain that developed last night when she was walking upstairs.  She reports pain associated with the anterior medial aspect of her knee.  There is no palpable joint effusion.  She reports that it hurts more when she bends the joint.  No direct trauma to the knee joint.  No tenderness of the popliteal fossa or swelling to the calf.  She endorses no other complaints or concerns regarding the knee joint itself.    Nursing Notes were reviewed.    REVIEW OF SYSTEMS       Review of Systems   Constitutional:  Negative for chills, fatigue and fever.   HENT:  Negative for congestion and sore throat.    Respiratory:  Negative for cough, shortness of breath and wheezing.    Cardiovascular:  Negative for chest pain and palpitations.   Gastrointestinal:  Negative for abdominal distention, abdominal pain, constipation and diarrhea.   Genitourinary:  Negative for dysuria and hematuria.   Musculoskeletal:  Negative for joint swelling and myalgias.   Skin:  Negative for rash and wound.   Neurological:  Negative for dizziness and headaches.   Psychiatric/Behavioral:  Negative for agitation. The patient is not nervous/anxious.    All other systems reviewed and are negative.    Except as noted above the remainder of the review of systems was reviewed and negative.     PAST MEDICAL HISTORY   No past medical history on file.    SURGICAL HISTORY     No past surgical history on file.    CURRENT MEDICATIONS        Previous Medications    No medications on file       ALLERGIES     Tramadol    FAMILY HISTORY     No family history on file.     SOCIAL HISTORY       Social History     Socioeconomic History    Marital status: Married       SCREENINGS       Glasgow Coma Scale  Eye Opening: Spontaneous  Best Verbal Response: Oriented  Best Motor Response: Obeys commands  Glasgow Coma Scale Score: 15             CIWA Assessment  BP: (!) 153/97  Pulse: 76             PHYSICAL EXAM       ED Triage Vitals [01/15/23 1932]   BP Temp Temp Source Pulse Respirations SpO2 Height Weight - Scale   (!) 153/97 98.4 F (36.9 C) Oral 76 18 97 % 1.702 m (5\' 7" ) 63.5 kg (140 lb)       Physical Exam  Vitals and nursing note reviewed.   Constitutional:       Appearance: Normal appearance.   HENT:  Head: Normocephalic and atraumatic.      Nose: Nose normal.      Mouth/Throat:      Mouth: Mucous membranes are moist.      Pharynx: Oropharynx is clear.   Eyes:      Conjunctiva/sclera: Conjunctivae normal.      Pupils: Pupils are equal, round, and reactive to light.   Cardiovascular:      Rate and Rhythm: Normal rate and regular rhythm.   Pulmonary:      Effort: Pulmonary effort is normal.      Breath sounds: Normal breath sounds.   Musculoskeletal:         General: No swelling or deformity. Normal range of motion.      Cervical back: Normal range of motion and neck supple.        Legs:       Comments: Patient has point tenderness as noted on illustration.  There is no palpable joint effusion.  No tenderness on palpation of the popliteal fossa.  There is no overlying edema, ecchymosis or erythema.   Skin:     General: Skin is warm and dry.   Neurological:      General: No focal deficit present.      Mental Status: She is alert. Mental status is at baseline.      Motor: No weakness.   Psychiatric:         Mood and Affect: Mood normal.         Behavior: Behavior normal.         MEDICAL DECISION MAKING:   MDM  Number of Diagnoses or Management  Options  Acute pain of right knee  Diagnosis management comments:     72 year old female presenting with complaint of pain associated with the anterior aspect of the right knee joint  There is no laxity to the joint on challenge  Imaging obtained in triage shows no acute abnormality  We will wrap the joint for support and I discussed purchasing a knee sleeve  We discussed Tylenol for pain control, she is anticoagulated on Eliquis due to history of atrial fibrillation  We will treat her conservatively for the next week and then I asked that if she has persistent symptoms I would like her to follow-up with orthopedics for reassessment and potential MRI  Patient was agreeable with this       Amount and/or Complexity of Data Reviewed  Tests in the radiology section of CPT: ordered and reviewed  Decide to obtain previous medical records or to obtain history from someone other than the patient: yes  Review and summarize past medical records: yes  Independent visualization of images, tracings, or specimens: yes        DIAGNOSTIC RESULTS   PROCEDURES:  Unless otherwise noted below, none     Procedures    EKG: All EKG's are interpreted by the Emergency Department Physician who either signs or Co-signs this chart in the absence of a cardiologist.    RADIOLOGY:   Non-plain film images such as CT, Ultrasound and MRI are read by the radiologist. Plain radiographic images are visualized and preliminarily interpreted by the emergency physician with the below findings:    Interpretation per the Radiologist below, if available at the time of this note:    XR KNEE RIGHT (1-2 VIEWS)   Final Result   Unremarkable right knee series.             LABS:  Labs Reviewed - No data to  display  All other labs were within normal range or not returned as of this dictation.    EMERGENCY DEPARTMENT COURSE/REASSESSMENT:   Vitals:    Vitals:    01/15/23 1932   BP: (!) 153/97   Pulse: 76   Resp: 18   Temp: 98.4 F (36.9 C)   TempSrc: Oral   SpO2:  97%   Weight: 63.5 kg (140 lb)   Height: 1.702 m (5\' 7" )       ED Course:       CONSULTS:  None    FINAL IMPRESSION      1. Acute pain of right knee          DISPOSITION/PLAN   DISPOSITION Decision To Discharge 01/15/2023 09:54:05 PM      PATIENT REFERRED TO:  Ronnie Doss, MD  [address]  [address] [place] [address]  [phone]    In 1 week  If symptoms worsen      DISCHARGE MEDICATIONS:  New Prescriptions    No medications on file       (Please note that portions of this note were completed with a voice recognition program.  Efforts were made to edit the dictations but occasionally words are mis-transcribed.)    (Note to patient: The 21st Century Cures Act requires that medical notes like this be available to patients in the interest of transparency. However, be advised this is a medical document. It is intended as peer to peer communication. It is written in medical language and may contain abbreviations or verbiage that are unfamiliar. It may appear blunt or direct. Medical documents are intended to carry relevant information, facts as evident, and the clinical opinion of the practitioner.)       Brendolyn Patty, MD (electronically signed)  Attending Emergency Physician           Brendolyn Patty, MD  01/15/23 2218

## 2023-01-20 ENCOUNTER — Ambulatory Visit: Payer: Medicare HMO

## 2023-01-20 DIAGNOSIS — D123 Benign neoplasm of transverse colon: Secondary | ICD-10-CM

## 2023-01-20 DIAGNOSIS — K580 Irritable bowel syndrome with diarrhea: Secondary | ICD-10-CM

## 2023-01-20 DIAGNOSIS — K640 First degree hemorrhoids: Secondary | ICD-10-CM

## 2023-01-20 DIAGNOSIS — K5730 Diverticulosis of large intestine without perforation or abscess without bleeding: Secondary | ICD-10-CM

## 2023-03-04 ENCOUNTER — Encounter (INDEPENDENT_AMBULATORY_CARE_PROVIDER_SITE_OTHER): Payer: Medicare HMO | Admitting: Ophthalmology

## 2023-03-04 DIAGNOSIS — D3132 Benign neoplasm of left choroid: Secondary | ICD-10-CM | POA: Diagnosis not present

## 2023-03-04 DIAGNOSIS — H33302 Unspecified retinal break, left eye: Secondary | ICD-10-CM | POA: Diagnosis not present

## 2023-03-04 DIAGNOSIS — H35371 Puckering of macula, right eye: Secondary | ICD-10-CM

## 2023-03-04 DIAGNOSIS — H43813 Vitreous degeneration, bilateral: Secondary | ICD-10-CM

## 2023-03-04 DIAGNOSIS — H59031 Cystoid macular edema following cataract surgery, right eye: Secondary | ICD-10-CM | POA: Diagnosis not present

## 2023-04-29 ENCOUNTER — Encounter: Payer: Medicare HMO | Admitting: Dermatology

## 2023-07-01 ENCOUNTER — Encounter (INDEPENDENT_AMBULATORY_CARE_PROVIDER_SITE_OTHER): Payer: Medicare HMO | Admitting: Ophthalmology

## 2023-07-01 DIAGNOSIS — D3132 Benign neoplasm of left choroid: Secondary | ICD-10-CM

## 2023-07-01 DIAGNOSIS — H43813 Vitreous degeneration, bilateral: Secondary | ICD-10-CM

## 2023-07-01 DIAGNOSIS — H59031 Cystoid macular edema following cataract surgery, right eye: Secondary | ICD-10-CM | POA: Diagnosis not present

## 2023-07-01 DIAGNOSIS — H33302 Unspecified retinal break, left eye: Secondary | ICD-10-CM

## 2023-07-01 DIAGNOSIS — H35371 Puckering of macula, right eye: Secondary | ICD-10-CM | POA: Diagnosis not present

## 2023-07-15 ENCOUNTER — Encounter: Payer: Self-pay | Admitting: Emergency Medicine

## 2023-07-15 ENCOUNTER — Emergency Department
Admission: EM | Admit: 2023-07-15 | Discharge: 2023-07-15 | Disposition: A | Discharge status: Home / Self Care | Payer: Medicare HMO | Attending: Emergency Medicine | Admitting: Emergency Medicine

## 2023-07-15 ENCOUNTER — Other Ambulatory Visit: Payer: Self-pay

## 2023-07-15 ENCOUNTER — Emergency Department: Payer: Medicare HMO

## 2023-07-15 DIAGNOSIS — A040 Enteropathogenic Escherichia coli infection: Secondary | ICD-10-CM | POA: Insufficient documentation

## 2023-07-15 DIAGNOSIS — Z7901 Long term (current) use of anticoagulants: Secondary | ICD-10-CM | POA: Insufficient documentation

## 2023-07-15 DIAGNOSIS — R002 Palpitations: Secondary | ICD-10-CM | POA: Insufficient documentation

## 2023-07-15 DIAGNOSIS — R519 Headache, unspecified: Secondary | ICD-10-CM | POA: Insufficient documentation

## 2023-07-15 DIAGNOSIS — R197 Diarrhea, unspecified: Secondary | ICD-10-CM | POA: Diagnosis not present

## 2023-07-15 DIAGNOSIS — R509 Fever, unspecified: Secondary | ICD-10-CM | POA: Diagnosis present

## 2023-07-15 DIAGNOSIS — R110 Nausea: Secondary | ICD-10-CM | POA: Diagnosis not present

## 2023-07-15 LAB — URINALYSIS, W/ REFLEX TO CULTURE (INFECTION SUSPECTED)
Bilirubin Urine: NEGATIVE
Glucose, UA: NEGATIVE mg/dL
Ketones, ur: NEGATIVE mg/dL
Leukocytes,Ua: NEGATIVE
Nitrite: NEGATIVE
Protein, ur: NEGATIVE mg/dL
Specific Gravity, Urine: 1.002 — ABNORMAL LOW (ref 1.005–1.030)
pH: 6 (ref 5.0–8.0)

## 2023-07-15 LAB — GASTROINTESTINAL PANEL BY PCR, STOOL (REPLACES STOOL CULTURE)

## 2023-07-15 LAB — BASIC METABOLIC PANEL
Anion gap: 8 (ref 5–15)
BUN: 11 mg/dL (ref 8–23)
CO2: 24 mmol/L (ref 22–32)
Calcium: 7.9 mg/dL — ABNORMAL LOW (ref 8.9–10.3)
Chloride: 100 mmol/L (ref 98–111)
Creatinine, Ser: 0.68 mg/dL (ref 0.44–1.00)
GFR, Estimated: >60 mL/min (ref >60)
Glucose, Bld: 148 mg/dL — ABNORMAL HIGH (ref 70–99)
Potassium: 3.6 mmol/L (ref 3.5–5.1)
Sodium: 132 mmol/L — ABNORMAL LOW (ref 135–145)

## 2023-07-15 LAB — C DIFFICILE QUICK SCREEN W PCR REFLEX
C Diff antigen: NEGATIVE
C Diff interpretation: NOT DETECTED
C Diff toxin: NEGATIVE

## 2023-07-15 LAB — CBC
HCT: 40.7 % (ref 36.0–46.0)
Hemoglobin: 14.1 g/dL (ref 12.0–15.0)
MCH: 31.5 pg (ref 26.0–34.0)
MCHC: 34.6 g/dL (ref 30.0–36.0)
MCV: 90.8 fL (ref 80.0–100.0)
Platelets: 173 10*3/uL (ref 150–400)
RBC: 4.48 MIL/uL (ref 3.87–5.11)
RDW: 12.7 % (ref 11.5–15.5)
WBC: 5.1 10*3/uL (ref 4.0–10.5)
nRBC: 0 % (ref 0.0–0.2)

## 2023-07-15 LAB — MAGNESIUM: Magnesium: 2 mg/dL (ref 1.7–2.4)

## 2023-07-15 MED ORDER — METOPROLOL TARTRATE 25 MG PO TABS
12.5000 mg | ORAL_TABLET | Freq: Once | ORAL | Status: AC
  Administered 2023-07-15: 12.5 mg via ORAL
  Filled 2023-07-15: qty 1

## 2023-07-15 MED ORDER — SODIUM CHLORIDE 0.9 % IV BOLUS
500.0000 mL | Freq: Once | INTRAVENOUS | Status: AC
Start: 2023-07-15 — End: 2023-07-15
  Administered 2023-07-15: 500 mL via INTRAVENOUS

## 2023-07-15 NOTE — Discharge Instructions (Signed)
You were seen in the emergency department for diarrhea.  You are found to be in atrial fibrillation and had an elevated heart rate.  You tested positive for E. coli.  Do not take you the steroids that were prescribed yesterday and do not take any of the ondansetron that was prescribed.  Do not feel that azithromycin the antibiotics that you were prescribed would be helpful for your E. coli.  It is importantly stay hydrated and drink plenty of fluids.  Follow-up closely with your primary care physician.  If your heart rate continues to be elevated at home you can take an extra dose of 12.5 mg of metoprolol.  Follow-up closely with your primary care physician and return to the emergency department for any worsening signs of dehydration.  Thank you for choosing [place] for your health care, it was my pleasure to care for you today!  Corena Herter, MD

## 2023-07-15 NOTE — ED Triage Notes (Signed)
Pt to ED via [facility] from home for palpitations for the last 12 hours. Pt has hx/o A. Fib and takes Eliquis. Pt denies others symptoms besides palpitations. EMS reports pt in A F.ib with a rate of 100-150 BMP. Pt given NS bolus with EMS.

## 2023-07-15 NOTE — ED Notes (Signed)
Patient provided ice water. Up x2 to toilet.

## 2023-07-15 NOTE — ED Notes (Signed)
ED Provider at bedside. 

## 2023-07-15 NOTE — ED Provider Notes (Signed)
[facility] Provider Note    Event Date/Time   First MD Initiated Contact with Patient 07/15/23 1546     (approximate)   History   Palpitations   HPI  [name] is a 72 y.o. female past medical history significant for paroxysmal atrial fibrillation on Eliquis, presents to the emergency department for heart palpitations and not feeling well.  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  States that she has not been feeling well over the past couple of days.  Endorses subjective fever and chills.  Cough, congestion and a mild headache.  Was having nausea and diarrhea.  Tested negative for COVID, influenza and RSV at urgent care yesterday.  Was started on a steroid and azithromycin.  Ongoing diarrhea today that has been watery.  States that she started feeling like her heart was fluttering and she was in A-fib.  Denies any ongoing fever or chills today.  Denies any abdominal pain.  No dysuria, urinary urgency or frequency.  Denies chest pain or shortness of breath.     Physical Exam   Triage Vital Signs: ED Triage Vitals  Encounter Vitals Group     BP 07/15/23 1545 138/80     Systolic BP Percentile --      Diastolic BP Percentile --      Pulse Rate 07/15/23 1542 (!) 123     Resp 07/15/23 1542 19     Temp 07/15/23 1543 98 F (36.7 C)     Temp Source 07/15/23 1542 Oral     SpO2 07/15/23 1543 96 %     Weight 07/15/23 1542 151 lb 10.8 oz (68.8 kg)     Height 07/15/23 1542 5\' 7"  (1.702 m)     Head Circumference --      Peak Flow --      Pain Score 07/15/23 1542 2     Pain Loc --      Pain Education --      Exclude from Growth Chart --     Most recent vital signs: Vitals:   07/15/23 1800 07/15/23 1900  BP: 117/65 124/87  Pulse: 85 73  Resp: 19 15  Temp:    SpO2: 96% 98%    Physical Exam Constitutional:      Appearance: She is well-developed.  HENT:     Head: Atraumatic.  Eyes:      Extraocular Movements: Extraocular movements intact.     Conjunctiva/sclera: Conjunctivae normal.     Pupils: Pupils are equal, round, and reactive to light.  Cardiovascular:     Rate and Rhythm: Tachycardia present. Rhythm irregular.  Pulmonary:     Effort: No respiratory distress.  Abdominal:     General: There is no distension.     Tenderness: There is no abdominal tenderness.  Musculoskeletal:        General: Normal range of motion.     Cervical back: Normal range of motion.  Skin:    General: Skin is warm.     Capillary Refill: Capillary refill takes less than 2 seconds.  Neurological:     Mental Status: She is alert. Mental status is at baseline.  Psychiatric:        Mood and Affect: Mood normal.     IMPRESSION / MDM / ASSESSMENT AND PLAN / ED COURSE  I reviewed the triage vital signs and the nursing notes.  Differential diagnosis including electrolyte abnormality, dehydration, recurrent atrial  fibrillation, C. difficile, GI pathogen, urinary tract infection, pneumonia  EKG  I, Corena Herter, the attending physician, personally viewed and interpreted this ECG.  Atrial fibrillation with a rate of 101.  Mildly prolonged QTc 501.  No significant ST elevation or depression.  No signs of acute ischemia or dysrhythmia.  Atrial fibrillation with a heart rate in the 90s while on cardiac telemetry.  RADIOLOGY I independently reviewed imaging, my interpretation of imaging: Chest x-ray with no signs of pneumonia  LABS (all labs ordered are listed, but only abnormal results are displayed) Labs interpreted as -    Labs Reviewed  GASTROINTESTINAL PANEL BY PCR, STOOL (REPLACES STOOL CULTURE) - Abnormal; Notable for the following components:      Result Value   Enteropathogenic E coli (EPEC) DETECTED (*)    All other components within normal limits  BASIC METABOLIC PANEL - Abnormal; Notable for the following components:   Sodium 132 (*)    Glucose, Bld 148 (*)     Calcium 7.9 (*)    All other components within normal limits  URINALYSIS, W/ REFLEX TO CULTURE (INFECTION SUSPECTED) - Abnormal; Notable for the following components:   Color, Urine STRAW (*)    APPearance CLEAR (*)    Specific Gravity, Urine 1.002 (*)    Hgb urine dipstick SMALL (*)    Bacteria, UA RARE (*)    All other components within normal limits  C DIFFICILE QUICK SCREEN W PCR REFLEX    CBC  MAGNESIUM     MDM   Clinical Course as of 07/16/23 0101  Thu Jul 15, 2023  1937 Enteropathogenic E coli (EPEC)(!): DETECTED [SM]    Clinical Course User Index [SM] Corena Herter, MD  Patient was given 500 bolus with EMS and given a second 500 bolus.  Able to tolerate p.o.  Abdomen is nontender to palpation did not feel that CT scan is necessary at this time.  C. difficile testing is negative.  GI pathogen panel is in process.  GI pathogen panel was positive for EPEC  On reevaluation patient is tolerating p.o.  States that she is feeling much better.  Given her nighttime dose of metoprolol and had improvement of her heart rate to 70s.  Discussed not taking the medications that was prescribed yesterday at urgent care including steroids, ondansetron or azithromycin.  Discussed symptomatic treatment and close follow-up as an outpatient with primary care provider.  Given return precautions for any ongoing or worsening symptoms.   PROCEDURES:  Critical Care performed: No  Procedures  Patient's presentation is most consistent with acute presentation with potential threat to life or bodily function.   MEDICATIONS ORDERED IN ED: Medications  sodium chloride 0.9 % bolus 500 mL (0 mLs Intravenous Stopped 07/15/23 1723)  metoprolol tartrate (LOPRESSOR) tablet 12.5 mg (12.5 mg Oral Given 07/15/23 1922)    FINAL CLINICAL IMPRESSION(S) / ED DIAGNOSES   Final diagnoses:  Intestinal infection due to enteropathogenic E. coli     Rx / DC Orders   ED Discharge Orders     None         Note:  This document was prepared using Dragon voice recognition software and may include unintentional dictation errors.   Corena Herter, MD 07/16/23 [phone]

## 2023-07-28 ENCOUNTER — Other Ambulatory Visit: Payer: Self-pay | Admitting: Nurse Practitioner

## 2023-07-28 DIAGNOSIS — R1312 Dysphagia, oropharyngeal phase: Secondary | ICD-10-CM

## 2023-08-03 ENCOUNTER — Ambulatory Visit
Admission: RE | Admit: 2023-08-03 | Discharge: 2023-08-03 | Disposition: A | Discharge status: Home / Self Care | Payer: Medicare HMO | Source: Ambulatory Visit | Attending: Nurse Practitioner | Admitting: Nurse Practitioner

## 2023-08-03 DIAGNOSIS — R1312 Dysphagia, oropharyngeal phase: Secondary | ICD-10-CM | POA: Diagnosis present

## 2023-10-05 ENCOUNTER — Encounter (INDEPENDENT_AMBULATORY_CARE_PROVIDER_SITE_OTHER): Payer: Medicare HMO | Admitting: Ophthalmology

## 2023-10-05 DIAGNOSIS — H59031 Cystoid macular edema following cataract surgery, right eye: Secondary | ICD-10-CM

## 2023-10-05 DIAGNOSIS — H33302 Unspecified retinal break, left eye: Secondary | ICD-10-CM

## 2023-10-05 DIAGNOSIS — D3132 Benign neoplasm of left choroid: Secondary | ICD-10-CM

## 2023-10-05 DIAGNOSIS — H43811 Vitreous degeneration, right eye: Secondary | ICD-10-CM | POA: Diagnosis not present

## 2023-10-05 DIAGNOSIS — H35371 Puckering of macula, right eye: Secondary | ICD-10-CM | POA: Diagnosis not present

## 2023-10-06 ENCOUNTER — Ambulatory Visit: Payer: Medicare HMO

## 2023-10-06 DIAGNOSIS — K2289 Other specified disease of esophagus: Secondary | ICD-10-CM | POA: Diagnosis not present

## 2023-10-06 DIAGNOSIS — K449 Diaphragmatic hernia without obstruction or gangrene: Secondary | ICD-10-CM | POA: Diagnosis not present

## 2023-10-06 DIAGNOSIS — K2100 Gastro-esophageal reflux disease with esophagitis, without bleeding: Secondary | ICD-10-CM | POA: Diagnosis present

## 2023-10-13 ENCOUNTER — Other Ambulatory Visit: Payer: Self-pay | Admitting: Nurse Practitioner

## 2023-10-13 DIAGNOSIS — K224 Dyskinesia of esophagus: Secondary | ICD-10-CM

## 2023-10-13 DIAGNOSIS — R933 Abnormal findings on diagnostic imaging of other parts of digestive tract: Secondary | ICD-10-CM

## 2023-10-13 DIAGNOSIS — K225 Diverticulum of esophagus, acquired: Secondary | ICD-10-CM

## 2023-10-13 DIAGNOSIS — K219 Gastro-esophageal reflux disease without esophagitis: Secondary | ICD-10-CM

## 2023-11-01 ENCOUNTER — Ambulatory Visit
Admission: RE | Admit: 2023-11-01 | Discharge: 2023-11-01 | Disposition: A | Discharge status: Home / Self Care | Payer: Medicare HMO | Source: Ambulatory Visit | Attending: Nurse Practitioner | Admitting: Nurse Practitioner

## 2023-11-01 DIAGNOSIS — K225 Diverticulum of esophagus, acquired: Secondary | ICD-10-CM | POA: Insufficient documentation

## 2023-11-01 DIAGNOSIS — R933 Abnormal findings on diagnostic imaging of other parts of digestive tract: Secondary | ICD-10-CM | POA: Diagnosis present

## 2023-11-01 DIAGNOSIS — R1310 Dysphagia, unspecified: Secondary | ICD-10-CM | POA: Diagnosis not present

## 2023-11-01 DIAGNOSIS — K219 Gastro-esophageal reflux disease without esophagitis: Secondary | ICD-10-CM | POA: Insufficient documentation

## 2023-11-01 DIAGNOSIS — K224 Dyskinesia of esophagus: Secondary | ICD-10-CM | POA: Insufficient documentation

## 2023-11-01 NOTE — Therapy (Signed)
 Modified Barium Swallow Study  Patient Details  Name: [name] MRN: [id number] Date of Birth: 01-21-1951  Today's Date: 11/01/2023  Modified Barium Swallow completed.  Full report located under Chart Review in the Imaging Section.  History of Present Illness 73 y.o. female who presents for MBSS. Pt with c/o of globus sensation and "choking" with solids. UGI,08/03/23, "1. Penetration seen with thin liquid. No aspiration.   2. Spontaneous large volume gastroesophageal reflux noted to the mid   third esophagus.   3. No mass, lesions or significant dysmotility. Small Zenker diverticulum "   Clinical Impression Pt seen for MBSS. Pt demonstrated a functional oropharyngeal swallow with trace-mild vallecular stasis appreciated which reduced with spontaneous cleansing swallows. Concern for primary esophageal dysphagia given GI hx (GERD, Zenker's diverticulum), esophageal screening (retention of barium with retrograde flow below PES, trace retention in Zenker's diverticulum), results of UGI, and pt's complaints. Recommend continuation of a regular diet with thin liquids. Well-moistened solids. Standard aspiration precautions, reflux precautions, and alternation of bites/sips. SLP to sign off as pt has no acute SLP needs at this time. Factors that may increase risk of adverse event in presence of aspiration Rubye Oaks & Clearance Coots 2021): Respiratory or GI disease  Swallow Evaluation Recommendations Recommendations: PO diet PO Diet Recommendation: Regular;Thin liquids (Level 0) Liquid Administration via: Spoon;Cup;Straw Medication Administration:  (as tolerated) Supervision: Patient able to self-feed Swallowing strategies  : Slow rate;Small bites/sips;Multiple dry swallows after each bite/sip;Follow solids with liquids Postural changes: Position pt fully upright for meals;Out of bed for meals (upright 60-90 minutes after meals) Oral care recommendations: Oral care BID (2x/day) Recommended consults:   (GI f/u)    Clyde Canterbury, M.S., CCC-SLP Speech-Language Pathologist [facility] [phone] (ASCOM)   Woodroe Chen 11/01/2023,1:44 PM

## 2023-12-02 ENCOUNTER — Encounter (INDEPENDENT_AMBULATORY_CARE_PROVIDER_SITE_OTHER): Payer: Medicare HMO | Admitting: Ophthalmology

## 2024-01-17 ENCOUNTER — Ambulatory Visit: Admitting: Dietician

## 2024-02-23 ENCOUNTER — Encounter: Attending: Gastroenterology | Admitting: Dietician

## 2024-02-23 ENCOUNTER — Encounter: Payer: Self-pay | Admitting: Dietician

## 2024-02-23 DIAGNOSIS — K580 Irritable bowel syndrome with diarrhea: Secondary | ICD-10-CM | POA: Diagnosis not present

## 2024-02-23 DIAGNOSIS — K219 Gastro-esophageal reflux disease without esophagitis: Secondary | ICD-10-CM | POA: Diagnosis present

## 2024-02-23 DIAGNOSIS — Z713 Dietary counseling and surveillance: Secondary | ICD-10-CM | POA: Insufficient documentation

## 2024-02-23 DIAGNOSIS — Z6822 Body mass index (BMI) 22.0-22.9, adult: Secondary | ICD-10-CM | POA: Diagnosis not present

## 2024-02-23 NOTE — Progress Notes (Signed)
 Medical Nutrition Therapy: Visit start time: 1320  end time: 1420  Assessment:   Referral Diagnosis: GERD, IBS-D Other medical history/ diagnoses: history of cholecystectomy; A-fib with ablation 11/2023 Psychosocial issues/ stress concerns: none  Medications, supplements: reconciled list in medical record   Current weight: 143lbs (from recent MD visit 01/20/24) Height: 5'7 BMI: 22  Progress and evaluation:  Patient reports improvement in diarrhea in recent months since avoiding lactose and taking Imodium daily.  GERD symptoms include raspy throat; no heartburn or chest pain She reports history of choking episodes, but none recently. She focuses on chewing food thoroughly before swallowing.  Avoids fried foods, spicy foods, large meals Food sensitivities: lactose, gluten -- makes her own bread with un-enriched flour No significant decrease in appetite per patient, but she has decreased interest in cooking/ preparing meals. Patient seeks help with further improvement in diarrhea, ensuring nutritional adequacy Has appt with [facility] specialist next month.   Dietary Intake:  Usual eating pattern includes 3 meals and 0-1 snacks per day. Dining out frequency: 0-1 meals per week. Who plans meals/ buys groceries? self Who prepares meals? self  Breakfast: eggs (does not like eggs much) -- occ with sausage, toast, local sourwood honey, fruit; strawberries, blueberries with lactose-free cottage cheese; turkish green tea daily; rarely pancakes Snack: 1/3 avocado on 1/2 pc bread maybe 2x a week Lunch: chicken salad sandwich with lettuce, apple, few chips; carnation breakfast essential shake with fairlife milk; peanut butter (natural) on wasa gf crackers, apple; salad with iceberg, spinach, carrots, tomato, deli meat, dried cranberries, nuts Snack: nonr Supper: chicken/ pork/ occ whitefish/ rarely beef + 2 veg ie green beans, peas, broccoli, carrots, zucchini Snack: lactose free ice cream Beverages:  water, green tea in am  Physical activity: limited recently due to heart palpitations after ablation procedure    Intervention:   Nutrition Care Education:   Basic nutrition: appropriate nutrient balance; appropriate meal and snack schedule; general nutrition guidelines; reviewed current eating pattern and nutritional adequacy. IBS/ diarrhea: small, frequent meals/ snacks vs larger, fewer daily meals; effects of high fiber foods, low fiber diet to follow for several weeks with gradual reintroduction of fiber containing foods GERD: small, frequent meals/ snacks, chewing food well, eating last meal at least 2-3 hours before lying down,   Other intervention notes: Patient is making healthy food choices and consuming well-balanced meals. She is motivated to continue. Reduction in fiber intake for a time might provide additional relief of diarrhea; patient will consider trying or might wait until appt with [facility].  No MNT follow up scheduled at this time; patient will schedule after next medical appt if needed.    Nutritional Diagnosis:  -1.4 Altered GI function As related to IBS-D, GERD.  As evidenced by chronic diarrhea partially resolved with diet changes and medication; history of cough and raspy throat due to acid reflux.   Education Materials given:  IBS Nutrition Therapy (NCM) Diarrhea Nutrition Therapy (NCM) Visit summary with goals/ instructions to be viewed via patient portal   Learner/ who was taught:  Patient   Level of understanding: Verbalizes/ demonstrates competency  Demonstrated degree of understanding via:   Teach back Learning barriers: None  Willingness to learn/ readiness for change: Eager, change in progress  Monitoring and Evaluation:  Dietary intake, exercise, GI symptoms, and body weight      follow up: prn

## 2024-02-23 NOTE — Patient Instructions (Signed)
 Consider following low fiber diet for 1-2 weeks, if symptoms improve, continue for a total of 12-16 weeks before gradually increasing fiber.  Continue with low fat, mildly seasoned foods, and eat a meal or snack every 3-4 hours during the day.

## 2024-03-02 ENCOUNTER — Encounter (INDEPENDENT_AMBULATORY_CARE_PROVIDER_SITE_OTHER): Payer: Medicare HMO | Admitting: Ophthalmology

## 2024-03-02 DIAGNOSIS — H35371 Puckering of macula, right eye: Secondary | ICD-10-CM | POA: Diagnosis not present

## 2024-03-02 DIAGNOSIS — H59031 Cystoid macular edema following cataract surgery, right eye: Secondary | ICD-10-CM

## 2024-03-02 DIAGNOSIS — D3132 Benign neoplasm of left choroid: Secondary | ICD-10-CM

## 2024-03-02 DIAGNOSIS — H33302 Unspecified retinal break, left eye: Secondary | ICD-10-CM

## 2024-03-02 DIAGNOSIS — H43813 Vitreous degeneration, bilateral: Secondary | ICD-10-CM | POA: Diagnosis not present

## 2024-07-27 ENCOUNTER — Encounter (INDEPENDENT_AMBULATORY_CARE_PROVIDER_SITE_OTHER): Admitting: Ophthalmology

## 2024-07-27 DIAGNOSIS — H59031 Cystoid macular edema following cataract surgery, right eye: Secondary | ICD-10-CM

## 2024-07-27 DIAGNOSIS — H33302 Unspecified retinal break, left eye: Secondary | ICD-10-CM | POA: Diagnosis not present

## 2024-07-27 DIAGNOSIS — H43813 Vitreous degeneration, bilateral: Secondary | ICD-10-CM

## 2024-07-27 DIAGNOSIS — D3132 Benign neoplasm of left choroid: Secondary | ICD-10-CM

## 2024-07-27 DIAGNOSIS — H353122 Nonexudative age-related macular degeneration, left eye, intermediate dry stage: Secondary | ICD-10-CM | POA: Diagnosis not present

## 2024-08-06 ENCOUNTER — Emergency Department

## 2024-08-06 ENCOUNTER — Inpatient Hospital Stay
Admission: EM | Admit: 2024-08-06 | Discharge: 2024-08-08 | DRG: 177 | Disposition: A | Attending: Internal Medicine | Admitting: Internal Medicine

## 2024-08-06 ENCOUNTER — Other Ambulatory Visit: Payer: Self-pay

## 2024-08-06 ENCOUNTER — Encounter: Payer: Self-pay | Admitting: Emergency Medicine

## 2024-08-06 DIAGNOSIS — R079 Chest pain, unspecified: Secondary | ICD-10-CM

## 2024-08-06 DIAGNOSIS — J180 Bronchopneumonia, unspecified organism: Secondary | ICD-10-CM

## 2024-08-06 DIAGNOSIS — U071 COVID-19: Principal | ICD-10-CM

## 2024-08-06 DIAGNOSIS — E785 Hyperlipidemia, unspecified: Secondary | ICD-10-CM

## 2024-08-06 DIAGNOSIS — R0602 Shortness of breath: Secondary | ICD-10-CM

## 2024-08-06 DIAGNOSIS — H409 Unspecified glaucoma: Secondary | ICD-10-CM | POA: Insufficient documentation

## 2024-08-06 DIAGNOSIS — R0609 Other forms of dyspnea: Secondary | ICD-10-CM

## 2024-08-06 DIAGNOSIS — I4891 Unspecified atrial fibrillation: Secondary | ICD-10-CM | POA: Diagnosis present

## 2024-08-06 LAB — CBC WITH DIFFERENTIAL/PLATELET
Abs Immature Granulocytes: 0.01 K/uL (ref 0.00–0.07)
Basophils Absolute: 0 K/uL (ref 0.0–0.1)
Basophils Relative: 1 %
Eosinophils Absolute: 0.1 K/uL (ref 0.0–0.5)
Eosinophils Relative: 2 %
HCT: 40.8 % (ref 36.0–46.0)
Hemoglobin: 14 g/dL (ref 12.0–15.0)
Immature Granulocytes: 0 %
Lymphocytes Relative: 17 %
Lymphs Abs: 0.7 K/uL (ref 0.7–4.0)
MCH: 31.8 pg (ref 26.0–34.0)
MCHC: 34.3 g/dL (ref 30.0–36.0)
MCV: 92.7 fL (ref 80.0–100.0)
Monocytes Absolute: 0.5 K/uL (ref 0.1–1.0)
Monocytes Relative: 14 %
Neutro Abs: 2.6 K/uL (ref 1.7–7.7)
Neutrophils Relative %: 66 %
Platelets: 151 K/uL (ref 150–400)
RBC: 4.4 MIL/uL (ref 3.87–5.11)
RDW: 12.4 % (ref 11.5–15.5)
WBC: 3.9 K/uL — ABNORMAL LOW (ref 4.0–10.5)
nRBC: 0 % (ref 0.0–0.2)

## 2024-08-06 LAB — GROUP A STREP BY PCR: Group A Strep by PCR: NOT DETECTED

## 2024-08-06 LAB — COMPREHENSIVE METABOLIC PANEL WITH GFR
ALT: 23 U/L (ref 0–44)
AST: 27 U/L (ref 15–41)
Albumin: 4.4 g/dL (ref 3.5–5.0)
Alkaline Phosphatase: 70 U/L (ref 38–126)
Anion gap: 11 (ref 5–15)
BUN: 13 mg/dL (ref 8–23)
CO2: 27 mmol/L (ref 22–32)
Calcium: 9.4 mg/dL (ref 8.9–10.3)
Chloride: 101 mmol/L (ref 98–111)
Creatinine, Ser: 0.7 mg/dL (ref 0.44–1.00)
GFR, Estimated: >60 mL/min (ref >60)
Glucose, Bld: 94 mg/dL (ref 70–99)
Potassium: 4.1 mmol/L (ref 3.5–5.1)
Sodium: 140 mmol/L (ref 135–145)
Total Bilirubin: 0.4 mg/dL (ref 0.0–1.2)
Total Protein: 7.1 g/dL (ref 6.5–8.1)

## 2024-08-06 LAB — URINALYSIS, W/ REFLEX TO CULTURE (INFECTION SUSPECTED)
Bacteria, UA: NONE SEEN
Bilirubin Urine: NEGATIVE
Glucose, UA: NEGATIVE mg/dL
Ketones, ur: NEGATIVE mg/dL
Leukocytes,Ua: NEGATIVE
Nitrite: NEGATIVE
Protein, ur: NEGATIVE mg/dL
Specific Gravity, Urine: 1.008 (ref 1.005–1.030)
Squamous Epithelial / HPF: 0 /HPF (ref 0–5)
pH: 7 (ref 5.0–8.0)

## 2024-08-06 LAB — PROTIME-INR
INR: 1.1 (ref 0.8–1.2)
Prothrombin Time: 14.5 s (ref 11.4–15.2)

## 2024-08-06 LAB — RESP PANEL BY RT-PCR (RSV, FLU A&B, COVID)  RVPGX2
Influenza A by PCR: NEGATIVE
Influenza B by PCR: NEGATIVE
Resp Syncytial Virus by PCR: NEGATIVE
SARS Coronavirus 2 by RT PCR: POSITIVE — AB

## 2024-08-06 LAB — LACTIC ACID, PLASMA
Lactic Acid, Venous: 1.4 mmol/L (ref 0.5–1.9)
Lactic Acid, Venous: 1.7 mmol/L (ref 0.5–1.9)

## 2024-08-06 MED ORDER — ACETAMINOPHEN 325 MG PO TABS
650.0000 mg | ORAL_TABLET | Freq: Once | ORAL | Status: AC
  Administered 2024-08-06: 650 mg via ORAL
  Filled 2024-08-06: qty 2

## 2024-08-06 MED ORDER — VANCOMYCIN HCL IN DEXTROSE 1-5 GM/200ML-% IV SOLN: 1000.0000 mg | Freq: Once | INTRAVENOUS | Status: DC

## 2024-08-06 MED ORDER — LACTATED RINGERS IV BOLUS (SEPSIS)
1000.0000 mL | Freq: Once | INTRAVENOUS | Status: AC
  Administered 2024-08-06: 1000 mL via INTRAVENOUS

## 2024-08-06 MED ORDER — METRONIDAZOLE 500 MG/100ML IV SOLN
500.0000 mg | Freq: Once | INTRAVENOUS | Status: AC
  Administered 2024-08-06: 500 mg via INTRAVENOUS
  Filled 2024-08-06: qty 100

## 2024-08-06 MED ORDER — SODIUM CHLORIDE 0.9 % IV SOLN
2.0000 g | Freq: Once | INTRAVENOUS | Status: AC
  Administered 2024-08-06: 2 g via INTRAVENOUS
  Filled 2024-08-06: qty 12.5

## 2024-08-06 NOTE — Progress Notes (Signed)
 CODE SEPSIS - PHARMACY COMMUNICATION  **Broad Spectrum Antibiotics should be administered within 1 hour of Sepsis diagnosis**  Time Code Sepsis Called/Page Received: 2224  Antibiotics Ordered: Cefepime, Flagyl, Vancomycin  Time of 1st antibiotic administration: 2309  [place] CANDIE Dills, PharmD, [facility] 08/06/2024 11:45 PM

## 2024-08-06 NOTE — ED Triage Notes (Addendum)
 Pt reports left sided chest tightness, fevers, cough,  chills, SHOB, urinary frequency beginning today. Reports worsening chest pain & SHOB when doing anything.  Reports hx Afib. Takes eliquis. Reports she is supposed to get cardioverted next week for afib. Hx ablation.

## 2024-08-07 ENCOUNTER — Emergency Department

## 2024-08-07 DIAGNOSIS — I4891 Unspecified atrial fibrillation: Secondary | ICD-10-CM | POA: Diagnosis present

## 2024-08-07 DIAGNOSIS — J180 Bronchopneumonia, unspecified organism: Secondary | ICD-10-CM

## 2024-08-07 DIAGNOSIS — H409 Unspecified glaucoma: Secondary | ICD-10-CM | POA: Insufficient documentation

## 2024-08-07 DIAGNOSIS — U071 COVID-19: Principal | ICD-10-CM

## 2024-08-07 DIAGNOSIS — E785 Hyperlipidemia, unspecified: Secondary | ICD-10-CM

## 2024-08-07 LAB — BASIC METABOLIC PANEL WITH GFR
Anion gap: 11 (ref 5–15)
BUN: 9 mg/dL (ref 8–23)
CO2: 23 mmol/L (ref 22–32)
Calcium: 8.6 mg/dL — ABNORMAL LOW (ref 8.9–10.3)
Chloride: 104 mmol/L (ref 98–111)
Creatinine, Ser: 0.58 mg/dL (ref 0.44–1.00)
GFR, Estimated: >60 mL/min (ref >60)
Glucose, Bld: 97 mg/dL (ref 70–99)
Potassium: 4 mmol/L (ref 3.5–5.1)
Sodium: 138 mmol/L (ref 135–145)

## 2024-08-07 LAB — CBC
HCT: 37.4 % (ref 36.0–46.0)
Hemoglobin: 12.7 g/dL (ref 12.0–15.0)
MCH: 32 pg (ref 26.0–34.0)
MCHC: 34 g/dL (ref 30.0–36.0)
MCV: 94.2 fL (ref 80.0–100.0)
Platelets: 136 K/uL — ABNORMAL LOW (ref 150–400)
RBC: 3.97 MIL/uL (ref 3.87–5.11)
RDW: 12.5 % (ref 11.5–15.5)
WBC: 4.2 K/uL (ref 4.0–10.5)
nRBC: 0 % (ref 0.0–0.2)

## 2024-08-07 LAB — TROPONIN T, HIGH SENSITIVITY
Troponin T High Sensitivity: <15 ng/L (ref 0–19)
Troponin T High Sensitivity: <15 ng/L (ref 0–19)

## 2024-08-07 MED ORDER — HYDROCOD POLI-CHLORPHE POLI ER 10-8 MG/5ML PO SUER
5.0000 mL | Freq: Two times a day (BID) | ORAL | Status: DC | PRN
  Administered 2024-08-07 – 2024-08-08 (×2): 5 mL via ORAL
  Filled 2024-08-07 (×2): qty 5

## 2024-08-07 MED ORDER — APIXABAN 5 MG PO TABS
5.0000 mg | ORAL_TABLET | Freq: Two times a day (BID) | ORAL | Status: DC
  Administered 2024-08-07 – 2024-08-08 (×3): 5 mg via ORAL
  Filled 2024-08-07 (×3): qty 1

## 2024-08-07 MED ORDER — SODIUM CHLORIDE 0.9 % IV SOLN
2.0000 g | INTRAVENOUS | Status: DC
  Administered 2024-08-07: 2 g via INTRAVENOUS
  Filled 2024-08-07: qty 20

## 2024-08-07 MED ORDER — METOPROLOL SUCCINATE ER 25 MG PO TB24: 25.0000 mg | ORAL_TABLET | Freq: Every day | ORAL | Status: DC

## 2024-08-07 MED ORDER — ACETAMINOPHEN 325 MG PO TABS: 650.0000 mg | ORAL_TABLET | Freq: Four times a day (QID) | ORAL | Status: DC | PRN

## 2024-08-07 MED ORDER — FLECAINIDE ACETATE 50 MG PO TABS
75.0000 mg | ORAL_TABLET | Freq: Two times a day (BID) | ORAL | Status: DC
  Administered 2024-08-07 – 2024-08-08 (×3): 75 mg via ORAL
  Filled 2024-08-07 (×3): qty 2

## 2024-08-07 MED ORDER — BRIMONIDINE TARTRATE 0.2 % OP SOLN
1.0000 [drp] | Freq: Three times a day (TID) | OPHTHALMIC | Status: DC
  Administered 2024-08-07: 1 [drp] via OPHTHALMIC
  Filled 2024-08-07: qty 5

## 2024-08-07 MED ORDER — KETOROLAC TROMETHAMINE 0.5 % OP SOLN
1.0000 [drp] | Freq: Four times a day (QID) | OPHTHALMIC | Status: DC
  Administered 2024-08-07: 1 [drp] via OPHTHALMIC
  Filled 2024-08-07: qty 3

## 2024-08-07 MED ORDER — MAGNESIUM HYDROXIDE 400 MG/5ML PO SUSP: 30.0000 mL | Freq: Every day | ORAL | Status: DC | PRN

## 2024-08-07 MED ORDER — ONDANSETRON HCL 4 MG/2ML IJ SOLN: 4.0000 mg | Freq: Four times a day (QID) | INTRAMUSCULAR | Status: DC | PRN

## 2024-08-07 MED ORDER — KETOROLAC TROMETHAMINE 0.5 % OP SOLN
1.0000 [drp] | Freq: Two times a day (BID) | OPHTHALMIC | Status: DC
  Administered 2024-08-07 – 2024-08-08 (×2): 1 [drp] via OPHTHALMIC
  Filled 2024-08-07: qty 3

## 2024-08-07 MED ORDER — LACTATED RINGERS IV BOLUS
1000.0000 mL | Freq: Once | INTRAVENOUS | Status: AC
  Administered 2024-08-07: 1000 mL via INTRAVENOUS

## 2024-08-07 MED ORDER — ENOXAPARIN SODIUM 40 MG/0.4ML IJ SOSY: 40.0000 mg | PREFILLED_SYRINGE | INTRAMUSCULAR | Status: DC

## 2024-08-07 MED ORDER — TRAZODONE HCL 50 MG PO TABS: 25.0000 mg | ORAL_TABLET | Freq: Every evening | ORAL | Status: DC | PRN

## 2024-08-07 MED ORDER — ONDANSETRON HCL 4 MG PO TABS: 4.0000 mg | ORAL_TABLET | Freq: Four times a day (QID) | ORAL | Status: DC | PRN

## 2024-08-07 MED ORDER — DILTIAZEM HCL-DEXTROSE 125-5 MG/125ML-% IV SOLN (PREMIX)
5.0000 mg/h | INTRAVENOUS | Status: DC
  Administered 2024-08-07: 5 mg/h via INTRAVENOUS
  Filled 2024-08-07: qty 125

## 2024-08-07 MED ORDER — IOHEXOL 350 MG/ML SOLN
75.0000 mL | Freq: Once | INTRAVENOUS | Status: AC | PRN
  Administered 2024-08-07: 75 mL via INTRAVENOUS

## 2024-08-07 MED ORDER — PREDNISOLONE ACETATE 1 % OP SUSP
1.0000 [drp] | Freq: Two times a day (BID) | OPHTHALMIC | Status: DC
  Filled 2024-08-07: qty 5

## 2024-08-07 MED ORDER — BRIMONIDINE TARTRATE 0.2 % OP SOLN
1.0000 [drp] | Freq: Two times a day (BID) | OPHTHALMIC | Status: DC
  Administered 2024-08-07 – 2024-08-08 (×2): 1 [drp] via OPHTHALMIC
  Filled 2024-08-07: qty 5

## 2024-08-07 MED ORDER — SODIUM CHLORIDE 0.9 % IV SOLN
500.0000 mg | INTRAVENOUS | Status: DC
  Administered 2024-08-07 – 2024-08-08 (×2): 500 mg via INTRAVENOUS
  Filled 2024-08-07 (×2): qty 5

## 2024-08-07 MED ORDER — METOPROLOL SUCCINATE ER 25 MG PO TB24
25.0000 mg | ORAL_TABLET | Freq: Two times a day (BID) | ORAL | Status: DC
  Administered 2024-08-07 – 2024-08-08 (×3): 25 mg via ORAL
  Filled 2024-08-07 (×3): qty 1

## 2024-08-07 MED ORDER — ACETAMINOPHEN 650 MG RE SUPP: 650.0000 mg | Freq: Four times a day (QID) | RECTAL | Status: DC | PRN

## 2024-08-07 MED ORDER — SODIUM CHLORIDE 0.9 % IV SOLN: INTRAVENOUS | Status: DC

## 2024-08-07 NOTE — Assessment & Plan Note (Signed)
-   This is left basal bronchopneumonia. - Will continue antibiotic therapy with IV Rocephin and Zithromax. - Mucolytic therapy be provided as well as duo nebs q.i.d. and q.4 hours p.r.n. - We will follow blood cultures.

## 2024-08-07 NOTE — Progress Notes (Signed)
 PROGRESS NOTE    [name]  FMW:[id number] DOB: [birth year] DOA: 08/06/2024 PCP: Rudolpho Norleen BIRCH, MD   Assessment & Plan:   Principal Problem:   Atrial fibrillation with rapid ventricular response (HCC) Active Problems:   Bronchopneumonia   COVID-19 virus infection   Dyslipidemia   Glaucoma  Assessment and Plan: PAF: d/c IV cardizem  as per cardio. Continue on flecainide, metoprolol , eliquis. Continue on tele. Cardio following and recs apprec    Pneumonia: secondary to COVID19. D/c rocephin, vanco. Continue w/ supportive care. No need for steroids as per is not currently requiring supplemental oxygen    COVID-19 virus infection: continue w/ supportive care. Continue on airbone and contact precautions    HLD: not on a statin as per med rec   Glaucoma: continue on home eye drops   Thrombocytopenia: etiology unclear. Will continue to monitor    DVT prophylaxis: eliquis Code Status: full  Family Communication:  Disposition Plan: likely d/c back home  Level of care: Progressive  Status is: Inpatient Remains inpatient appropriate because: severity of illness    Consultants:  Cardio   Procedures:   Antimicrobials:   Subjective: Pt c/o fatigue   Objective: Vitals:   08/07/24 0630 08/07/24 0700 08/07/24 0730 08/07/24 0800  BP: (!) 120/92 128/76 130/75 126/85  Pulse: 81 60 82 83  Resp: 17 17 (!) 22 (!) 27  Temp: 98.4 F (36.9 C)     TempSrc: Oral     SpO2: 99% 94% 95% 99%  Weight:      Height:        Intake/Output Summary (Last 24 hours) at 08/07/2024 0914 Last data filed at 08/07/2024 9281 Gross per 24 hour  Intake 2386.65 ml  Output --  Net 2386.65 ml   Filed Weights   08/07/24 0225  Weight: 64.4 kg    Examination:  General exam: Appears calm and comfortable  Respiratory system: decreased breath sounds b/l Cardiovascular system: irregularly irregular. No gallops or clicks.  Gastrointestinal system: Abdomen is nondistended, soft and  nontender.  Normal bowel sounds heard. Central nervous system: Alert and oriented. Moves all extremities  Psychiatry: Judgement and insight appear normal. Mood & affect appropriate.     Data Reviewed: I have personally reviewed following labs and imaging studies  CBC: Recent Labs  Lab 08/06/24 2219 08/07/24 0348  WBC 3.9* 4.2  NEUTROABS 2.6  --   HGB 14.0 12.7  HCT 40.8 37.4  MCV 92.7 94.2  PLT 151 136*   Basic Metabolic Panel: Recent Labs  Lab 08/06/24 2219 08/07/24 0348  NA 140 138  K 4.1 4.0  CL 101 104  CO2 27 23  GLUCOSE 94 97  BUN 13 9  CREATININE 0.70 0.58  CALCIUM 9.4 8.6*   GFR: Estimated Creatinine Clearance: 61.8 mL/min (by C-G formula based on SCr of 0.58 mg/dL). Liver Function Tests: Recent Labs  Lab 08/06/24 2219  AST 27  ALT 23  ALKPHOS 70  BILITOT 0.4  PROT 7.1  ALBUMIN 4.4   No results for input(s): LIPASE, AMYLASE in the last 168 hours. No results for input(s): AMMONIA in the last 168 hours. Coagulation Profile: Recent Labs  Lab 08/06/24 2219  INR 1.1   Cardiac Enzymes: No results for input(s): CKTOTAL, CKMB, CKMBINDEX, TROPONINI in the last 168 hours. BNP (last 3 results) No results for input(s): PROBNP in the last 8760 hours. HbA1C: No results for input(s): HGBA1C in the last 72 hours. CBG: No results for input(s): GLUCAP in the last 168  hours. Lipid Profile: No results for input(s): CHOL, HDL, LDLCALC, TRIG, CHOLHDL, LDLDIRECT in the last 72 hours. Thyroid  Function Tests: No results for input(s): TSH, T4TOTAL, FREET4, T3FREE, THYROIDAB in the last 72 hours. Anemia Panel: No results for input(s): VITAMINB12, FOLATE, FERRITIN, TIBC, IRON, RETICCTPCT in the last 72 hours. Sepsis Labs: Recent Labs  Lab 08/06/24 2219 08/06/24 2227  LATICACIDVEN 1.7 1.4    Recent Results (from the past 240 hours)  Culture, blood (Routine x 2)     Status: None (Preliminary result)    Collection Time: 08/06/24 10:19 PM   Specimen: BLOOD  Result Value Ref Range Status   Specimen Description BLOOD BLOOD RIGHT ARM  Final   Special Requests   Final    BOTTLES DRAWN AEROBIC AND ANAEROBIC Blood Culture adequate volume   Culture   Final    NO GROWTH < 12 HOURS Performed at [facility], [address], [place], [place] [address]    Report Status PENDING  Incomplete  Resp panel by RT-PCR (RSV, Flu A&B, Covid) Anterior Nasal Swab     Status: Abnormal   Collection Time: 08/06/24 10:27 PM   Specimen: Anterior Nasal Swab  Result Value Ref Range Status   SARS Coronavirus 2 by RT PCR POSITIVE (A) NEGATIVE Final    Comment: (NOTE) SARS-CoV-2 target nucleic acids are DETECTED.  The SARS-CoV-2 RNA is generally detectable in upper respiratory specimens during the acute phase of infection. Positive results are indicative of the presence of the identified virus, but do not rule out bacterial infection or co-infection with other pathogens not detected by the test. Clinical correlation with patient history and other diagnostic information is necessary to determine patient infection status. The expected result is Negative.  Fact Sheet for Patients: bloggercourse.com  Fact Sheet for Healthcare Providers: seriousbroker.it  This test is not yet approved or cleared by the [place]  FDA and  has been authorized for detection and/or diagnosis of SARS-CoV-2 by FDA under an Emergency Use Authorization (EUA).  This EUA will remain in effect (meaning this test can be used) for the duration of  the COVID-19 declaration under Section 564(b)(1) of the A ct, 21 U.S.C. section 360bbb-3(b)(1), unless the authorization is terminated or revoked sooner.     Influenza A by PCR NEGATIVE NEGATIVE Final   Influenza B by PCR NEGATIVE NEGATIVE Final    Comment: (NOTE) The Xpert Xpress SARS-CoV-2/FLU/RSV plus assay is intended as an  aid in the diagnosis of influenza from Nasopharyngeal swab specimens and should not be used as a sole basis for treatment. Nasal washings and aspirates are unacceptable for Xpert Xpress SARS-CoV-2/FLU/RSV testing.  Fact Sheet for Patients: bloggercourse.com  Fact Sheet for Healthcare Providers: seriousbroker.it  This test is not yet approved or cleared by the [place]  FDA and has been authorized for detection and/or diagnosis of SARS-CoV-2 by FDA under an Emergency Use Authorization (EUA). This EUA will remain in effect (meaning this test can be used) for the duration of the COVID-19 declaration under Section 564(b)(1) of the Act, 21 U.S.C. section 360bbb-3(b)(1), unless the authorization is terminated or revoked.     Resp Syncytial Virus by PCR NEGATIVE NEGATIVE Final    Comment: (NOTE) Fact Sheet for Patients: bloggercourse.com  Fact Sheet for Healthcare Providers: seriousbroker.it  This test is not yet approved or cleared by the [place]  FDA and has been authorized for detection and/or diagnosis of SARS-CoV-2 by FDA under an Emergency Use Authorization (EUA). This EUA will remain in effect (meaning  this test can be used) for the duration of the COVID-19 declaration under Section 564(b)(1) of the Act, 21 U.S.C. section 360bbb-3(b)(1), unless the authorization is terminated or revoked.  Performed at [facility], [address] [place], [place] [address]   Group A Strep by PCR [facility] Only)     Status: None   Collection Time: 08/06/24 10:43 PM   Specimen: Throat; Sterile Swab  Result Value Ref Range Status   Group A Strep by PCR NOT DETECTED NOT DETECTED Final    Comment: Performed at [facility], [address] [place], [place] [address]  Culture, blood (Routine x 2)     Status: None (Preliminary result)   Collection Time: 08/06/24 10:50 PM    Specimen: BLOOD RIGHT HAND  Result Value Ref Range Status   Specimen Description BLOOD RIGHT HAND  Final   Special Requests   Final    BOTTLES DRAWN AEROBIC AND ANAEROBIC Blood Culture results may not be optimal due to an inadequate volume of blood received in culture bottles   Culture   Final    NO GROWTH < 12 HOURS Performed at [facility], [address] [place], [place] [address]    Report Status PENDING  Incomplete         Radiology Studies: CT Angio Chest PE W/Cm &/Or Wo Cm Result Date: 08/07/2024 EXAM: CTA CHEST 08/07/2024 01:39:55 AM TECHNIQUE: CTA of the chest was performed after the administration of intravenous contrast. Multiplanar reformatted images are provided for review. MIP images are provided for review. Automated exposure control, iterative reconstruction, and/or weight based adjustment of the mA/kV was utilized to reduce the radiation dose to as low as reasonably achievable. COMPARISON: Portable chest 08/06/2024, portable chest 07/15/2023. CLINICAL HISTORY: Pulmonary embolism (PE) suspected, high prob. Left-sided chest tightness, fevers, coughing, chills, and shortness of breath. Increasingly experiencing chest pain and shortness of breath with exertion. FINDINGS: PULMONARY ARTERIES: Pulmonary arteries are adequately opacified for evaluation. No acute pulmonary embolus. Main pulmonary artery is normal in caliber. MEDIASTINUM: There is cardiomegaly with a biatrial chamber predominance, greater prominence of the right atrium, and IVC reflux, which may be seen with right heart dysfunction or tricuspid regurgitation. There is a slightly high RV/LV ratio of 1.04 but there is no arterial dilatation or embolism. There is no substantial pericardial effusion. No visible coronary calcifications. There is a normal variant retroesophageal aberrant right subclavian artery. There is mild atherosclerosis in the aorta and great vessels without aneurysm, stenosis, or dissection.  Pulmonary veins are nondilated. LYMPH NODES: No mediastinal, hilar or axillary lymphadenopathy. LUNGS AND PLEURA: There is bilateral apical reticulated scarring. There is diffuse bronchial thickening, greatest in the lower lobes, with scattered subsegmental bronchial impactions in both lower lobes. There are posterior atelectatic changes in both lungs with asymmetric posterior basal haziness in the left lower lobe, which could be asymmetric atelectasis or developing bronchopneumonia. No evidence of pleural effusion or pneumothorax. There is mild elevation of the right hemidiaphragm. UPPER ABDOMEN: The gallbladder is absent, with no acute upper abdominal findings. SOFT TISSUES AND BONES: Bilateral breast implants with no chest wall mass. There is osteopenia, mild kyphodextroscoliosis and degenerative changes of the thoracic spine. No acute or other significant osseous findings. IMPRESSION: 1. No evidence of pulmonary embolism. 2. Cardiomegaly with biatrial chamber predominance, greater prominence of the right atrium, and IVC reflux, suggestive of right heart dysfunction or tricuspid regurgitation. Slightly high RV/LV ratio of 1.04. 3. Posterior atelectatic changes in both lungs with asymmetric posterior  basal haziness in the left lower lobe, which could be asymmetric atelectasis or developing bronchopneumonia. 4. Diffuse bronchial thickening , with scattered subsegmental bronchial impactions in the lower lobes. 5. Aberrant right subclavian artery. Electronically signed by: Francis Quam MD 08/07/2024 02:06 AM EST RP Workstation: [id number]   DG Chest Port 1 View Result Date: 08/06/2024 CLINICAL DATA:  Chest pain EXAM: PORTABLE CHEST 1 VIEW COMPARISON:  Chest x-ray 07/15/2023 FINDINGS: The heart size and mediastinal contours are within normal limits. Both lungs are clear. The visualized skeletal structures are unremarkable. IMPRESSION: No active disease. Electronically Signed   By: Greig Pique M.D.   On:  08/06/2024 23:04        Scheduled Meds:  apixaban  5 mg Oral BID   brimonidine  1 drop Both Eyes TID   ketorolac  1 drop Left Eye QID   metoprolol  succinate  25 mg Oral Daily   prednisoLONE acetate  1 drop Right Eye BID   Continuous Infusions:  sodium chloride  100 mL/hr at 08/07/24 0336   azithromycin Stopped (08/07/24 0718)   cefTRIAXone (ROCEPHIN)  IV 2 g (08/07/24 0819)   diltiazem  (CARDIZEM ) infusion Stopped (08/07/24 0521)   vancomycin       LOS: 0 days       Anthony CHRISTELLA Pouch, MD [facility] Pager [phone]  If 7PM-7AM, please contact night-coverage [email or web address] 08/07/2024, 9:14 AM

## 2024-08-07 NOTE — H&P (Addendum)
 [place]   PATIENT NAME: [name]    MR#:  [id number]  DATE OF BIRTH:  [birth year]  DATE OF ADMISSION:  08/06/2024  PRIMARY CARE PHYSICIAN: Rudolpho Norleen BIRCH, MD   Patient is coming from: Home  REQUESTING/REFERRING PHYSICIAN: Jossie Birmingham, MD  CHIEF COMPLAINT:   Chief Complaint  Patient presents with   Chest Pain   Fever    HISTORY OF PRESENT ILLNESS:  [name] is a 73 y.o. Caucasian female with medical history significant for depression, paroxysmal atrial fibrillation, dyslipidemia and osteoarthritis, who presented to the emergency room with acute onset of chills that started around 3 PM yesterday followed by fever with a Tmax of 102.6 after having dinner.  She became tachycardic to 130.  She admitted to mild headache with dizziness as well as cough with inability to expectorate as with dyspnea without wheezing.  She admitted to left-sided chest tightness with her dyspnea and palpitations.  Her tightness was radiating to her left shoulder and intermittent.  No loss of taste or smell.  She had occasional nausea without vomiting or diarrhea.  No dysuria, oliguria or hematuria or flank pain.  ED Course: When she came to the ER, temperature was 101.6 and heart rate was 126 with a BP 151/95 approximately 95% on room air and respiratory rate 16 and later 24.  Labs revealed unremarkable CMP.  CBC showed WBCs of 3.9.  Respiratory panel came back positive for COVID-19.  UA was unremarkable.  2 blood cultures were drawn.  Group A strep PCR came back negative. EKG as reviewed by me : EKG shows A-fib flutter with a rate of 131 with left axis deviation and low voltage QRS with Q waves anteroseptally. Imaging: Chest x-ray showed no acute cardiopulmonary disease.  CTA of the chest revealed the following: 1. No evidence of pulmonary embolism. 2. Cardiomegaly with biatrial chamber predominance, greater prominence of the right atrium, and IVC reflux, suggestive of right  heart dysfunction or tricuspid regurgitation. Slightly high RV/LV ratio of 1.04. 3. Posterior atelectatic changes in both lungs with asymmetric posterior basal haziness in the left lower lobe, which could be asymmetric atelectasis or developing bronchopneumonia. 4. Diffuse bronchial thickening , with scattered subsegmental bronchial impactions in the lower lobes. 5. Aberrant right subclavian artery.  The patient was given IV vancomycin, cefepime and Flagyl and 2 L bolus of IV lactated Ringer as well as IV Cardizem  and 650 mg p.o. Tylenol .  She will be admitted to a progressive unit bed for further evaluation and management.  PAST MEDICAL HISTORY:   Past Medical History:  Diagnosis Date   Adenomatous colon polyp    Allergy    SEASONAL   Complication of anesthesia    reports that she has given a medication that mades her relax but it make her anxious, requesting not to have this medication, does not know the name but it is given through the IV.   Depression    Dysrhythmia    AFIB   Gallstones    Headache    Hyperlipidemia    Osteoarthritis    Pneumonia     PAST SURGICAL HISTORY:   Past Surgical History:  Procedure Laterality Date   ABDOMINAL HYSTERECTOMY     APPENDECTOMY     BREAST ENHANCEMENT SURGERY     CARPOMETACARPAL (CMC) FUSION OF THUMB Right 04/02/2021   Procedure: REVISION SUSPENSION ARTHROPLASTY OF RIGHT THUMB [facility] JOINT.;  Surgeon: Edie Norleen JINNY, MD;  Location: [facility] ORS;  Service:  Orthopedics;  Laterality: Right;   CATARACT EXTRACTION W/ INTRAOCULAR LENS  IMPLANT, BILATERAL  01/06/2015   CHOLECYSTECTOMY  09/07/1988   COLONOSCOPY     KNEE ARTHROSCOPY     right   partial vaginal hysterectomy     REPAIR EXTENSOR TENDON Left 10/07/2022   Procedure: 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.;  Surgeon: Edie Norleen PARAS, MD;  Location: [facility] ORS;  Service: Orthopedics;  Laterality: Left;   THUMB ARTHROSCOPY     bilateral   TONSILLECTOMY      SOCIAL  HISTORY:   Social History   Tobacco Use   Smoking status: Never   Smokeless tobacco: Never   Tobacco comments:    Tried about 2x with cousin at age 16  Substance Use Topics   Alcohol use: Yes    Comment: rarely    FAMILY HISTORY:   Family History  Problem Relation Age of Onset   Prostate cancer Father    Diabetes Father    Heart disease Father    Hypertension Mother    Stroke Mother    Heart attack Paternal Grandfather    Dementia Paternal Grandmother    Ulcers Maternal Grandfather        bleeding   Anuerysm Maternal Grandfather        AAA    DRUG ALLERGIES:   Allergies  Allergen Reactions   Naproxen Other (See Comments)    headache   Lidocaine  Hives   Amoxicillin -Pot Clavulanate Diarrhea   Celecoxib Other (See Comments)   Gentamicin Other (See Comments)    Peeled away corneas   Gluten Meal Diarrhea and Other (See Comments)    Abdominal cramps with enriched wheat flour   Lactose Diarrhea and Other (See Comments)   Other Other (See Comments)    Flu vaccine-chills/sweats    Ultram  [Tramadol ] Other (See Comments)    insomnia   Meloxicam Palpitations    heart palpitations    REVIEW OF SYSTEMS:   ROS As per history of present illness. All pertinent systems were reviewed above. Constitutional, HEENT, cardiovascular, respiratory, GI, GU, musculoskeletal, neuro, psychiatric, endocrine, integumentary and hematologic systems were reviewed and are otherwise negative/unremarkable except for positive findings mentioned above in the HPI.   MEDICATIONS AT HOME:   Prior to Admission medications   Medication Sig Start Date End Date Taking? Authorizing Provider  acetaminophen  (TYLENOL ) 500 MG tablet Take 500 mg by mouth every 6 (six) hours as needed (pain).    [provider]  apixaban (ELIQUIS) 5 MG TABS tablet Take 5 mg by mouth 2 (two) times daily.    [provider]  brimonidine (ALPHAGAN) 0.2 % ophthalmic solution SMARTSIG:In Eye(s) 01/24/24    [provider]  COLLAGEN PO Take 1 Scoop by mouth every evening. Multi Collagen Protein powder    [provider]  estrogens, conjugated, (PREMARIN) 0.625 MG tablet Take 0.625 mg by mouth. 02/23/24   [provider]  ketorolac (ACULAR) 0.5 % ophthalmic solution Place 1 drop into the left eye in the morning and at bedtime.    [provider]  loperamide (IMODIUM A-D) 2 MG tablet Take 2-4 mg by mouth 4 (four) times daily as needed for diarrhea or loose stools.    [provider]  metoprolol  succinate (TOPROL -XL) 25 MG 24 hr tablet     [provider]  Omega-3 1000 MG CAPS Take by mouth.    [provider]  prednisoLONE acetate (PRED FORTE) 1 % ophthalmic suspension Place 1 drop  into the right eye 2 (two) times daily.    [provider]  triamcinolone  ointment (KENALOG ) 0.5 % Apply topically. 09/24/23   [provider]      VITAL SIGNS:  Blood pressure (!) 120/92, pulse 81, temperature 98.4 F (36.9 C), temperature source Oral, resp. rate 17, height 5' 7 (1.702 m), weight 64.4 kg, SpO2 99%.  PHYSICAL EXAMINATION:  Physical Exam  GENERAL:  73 y.o.-year-old patient lying in the bed with no acute distress.  EYES: Pupils equal, round, reactive to light and accommodation. No scleral icterus. Extraocular muscles intact.  HEENT: Head atraumatic, normocephalic. Oropharynx and nasopharynx clear.  NECK:  Supple, no jugular venous distention. No thyroid  enlargement, no tenderness.  LUNGS: Normal breath sounds bilaterally, no wheezing, rales,rhonchi or crepitation. No use of accessory muscles of respiration.  CARDIOVASCULAR: Regular rate and rhythm, S1, S2 normal. No murmurs, rubs, or gallops.  ABDOMEN: Soft, nondistended, nontender. Bowel sounds present. No organomegaly or mass.  EXTREMITIES: No pedal edema, cyanosis, or clubbing.  NEUROLOGIC: Cranial nerves II through XII are intact. Muscle strength 5/5 in all extremities.  Sensation intact. Gait not checked.  PSYCHIATRIC: The patient is alert and oriented x 3.  Normal affect and good eye contact. SKIN: No obvious rash, lesion, or ulcer.   LABORATORY PANEL:   CBC Recent Labs  Lab 08/07/24 0348  WBC 4.2  HGB 12.7  HCT 37.4  PLT 136*   ------------------------------------------------------------------------------------------------------------------  Chemistries  Recent Labs  Lab 08/06/24 2219 08/07/24 0348  NA 140 138  K 4.1 4.0  CL 101 104  CO2 27 23  GLUCOSE 94 97  BUN 13 9  CREATININE 0.70 0.58  CALCIUM 9.4 8.6*  AST 27  --   ALT 23  --   ALKPHOS 70  --   BILITOT 0.4  --    ------------------------------------------------------------------------------------------------------------------  Cardiac Enzymes No results for input(s): TROPONINI in the last 168 hours. ------------------------------------------------------------------------------------------------------------------  RADIOLOGY:  CT Angio Chest PE W/Cm &/Or Wo Cm Result Date: 08/07/2024 EXAM: CTA CHEST 08/07/2024 01:39:55 AM TECHNIQUE: CTA of the chest was performed after the administration of intravenous contrast. Multiplanar reformatted images are provided for review. MIP images are provided for review. Automated exposure control, iterative reconstruction, and/or weight based adjustment of the mA/kV was utilized to reduce the radiation dose to as low as reasonably achievable. COMPARISON: Portable chest 08/06/2024, portable chest 07/15/2023. CLINICAL HISTORY: Pulmonary embolism (PE) suspected, high prob. Left-sided chest tightness, fevers, coughing, chills, and shortness of breath. Increasingly experiencing chest pain and shortness of breath with exertion. FINDINGS: PULMONARY ARTERIES: Pulmonary arteries are adequately opacified for evaluation. No acute pulmonary embolus. Main pulmonary artery is normal in caliber. MEDIASTINUM: There is cardiomegaly with a biatrial chamber  predominance, greater prominence of the right atrium, and IVC reflux, which may be seen with right heart dysfunction or tricuspid regurgitation. There is a slightly high RV/LV ratio of 1.04 but there is no arterial dilatation or embolism. There is no substantial pericardial effusion. No visible coronary calcifications. There is a normal variant retroesophageal aberrant right subclavian artery. There is mild atherosclerosis in the aorta and great vessels without aneurysm, stenosis, or dissection. Pulmonary veins are nondilated. LYMPH NODES: No mediastinal, hilar or axillary lymphadenopathy. LUNGS AND PLEURA: There is bilateral apical reticulated scarring. There is diffuse bronchial thickening, greatest in the lower lobes, with scattered subsegmental bronchial impactions in both lower lobes. There are posterior atelectatic changes in both lungs with asymmetric posterior basal haziness in the left lower lobe, which could be  asymmetric atelectasis or developing bronchopneumonia. No evidence of pleural effusion or pneumothorax. There is mild elevation of the right hemidiaphragm. UPPER ABDOMEN: The gallbladder is absent, with no acute upper abdominal findings. SOFT TISSUES AND BONES: Bilateral breast implants with no chest wall mass. There is osteopenia, mild kyphodextroscoliosis and degenerative changes of the thoracic spine. No acute or other significant osseous findings. IMPRESSION: 1. No evidence of pulmonary embolism. 2. Cardiomegaly with biatrial chamber predominance, greater prominence of the right atrium, and IVC reflux, suggestive of right heart dysfunction or tricuspid regurgitation. Slightly high RV/LV ratio of 1.04. 3. Posterior atelectatic changes in both lungs with asymmetric posterior basal haziness in the left lower lobe, which could be asymmetric atelectasis or developing bronchopneumonia. 4. Diffuse bronchial thickening , with scattered subsegmental bronchial impactions in the lower lobes. 5. Aberrant  right subclavian artery. Electronically signed by: Francis Quam MD 08/07/2024 02:06 AM EST RP Workstation: [id number]   DG Chest Port 1 View Result Date: 08/06/2024 CLINICAL DATA:  Chest pain EXAM: PORTABLE CHEST 1 VIEW COMPARISON:  Chest x-ray 07/15/2023 FINDINGS: The heart size and mediastinal contours are within normal limits. Both lungs are clear. The visualized skeletal structures are unremarkable. IMPRESSION: No active disease. Electronically Signed   By: Greig Pique M.D.   On: 08/06/2024 23:04      IMPRESSION AND PLAN:  Assessment and Plan: * Atrial fibrillation with rapid ventricular response (HCC) - The patient will be admitted to a progressive unit bed. - Will continue her on IV Cardizem . - Will follow serial troponins. - Will obtain 2D echo. - Will continue her Eliquis. - Cardiology consult will be obtained. - I notified Dr. Custovic about the patient.  Bronchopneumonia - This is left basal bronchopneumonia. - Will continue antibiotic therapy with IV Rocephin and Zithromax. - Mucolytic therapy be provided as well as duo nebs q.i.d. and q.4 hours p.r.n. - We will follow blood cultures.   COVID-19 virus infection - No current hypoxia and no need for steroids. - Conservative management will be pursued as above.   Dyslipidemia - Will continue fish oil/Lovaza.  Glaucoma - Will continue ophthalmic gtt.   DVT prophylaxis: Eliquis. Advanced Care Planning:  Code Status: full code.  Family Communication:  The plan of care was discussed in details with the patient (and family). I answered all questions. The patient agreed to proceed with the above mentioned plan. Further management will depend upon hospital course. Disposition Plan: Back to previous home environment Consults called: Cardiology. All the records are reviewed and case discussed with ED provider.  Status is: Inpatient  At the time of the admission, it appears that the appropriate admission status for  this patient is inpatient.  This is judged to be reasonable and necessary in order to provide the required intensity of service to ensure the patient's safety given the presenting symptoms, physical exam findings and initial radiographic and laboratory data in the context of comorbid conditions.  The patient requires inpatient status due to high intensity of service, high risk of further deterioration and high frequency of surveillance required.  I certify that at the time of admission, it is my clinical judgment that the patient will require inpatient hospital care extending more than 2 midnights.                            Dispo: The patient is from: Home              Anticipated d/c  is to: Home              Patient currently is not medically stable to d/c.              Difficult to place patient: No  Madison DELENA Peaches M.D on 08/07/2024 at 6:47 AM  [facility]   From 7 PM-7 AM, contact night-coverage [email or web address]  CC: Primary care physician; Rudolpho Norleen BIRCH, MD

## 2024-08-07 NOTE — ED Notes (Signed)
 Called Pharmacy to have pt's eye drop medications updated per how pt takes them. Pharmacy stated that they would have their tech go back redo the med reconciliation.

## 2024-08-07 NOTE — Assessment & Plan Note (Signed)
-   Will continue ophthalmic gtt.

## 2024-08-07 NOTE — Assessment & Plan Note (Signed)
-   Will continue fish oil/Lovaza.

## 2024-08-07 NOTE — Assessment & Plan Note (Signed)
-   No current hypoxia and no need for steroids. - Conservative management will be pursued as above.

## 2024-08-07 NOTE — Assessment & Plan Note (Addendum)
-   The patient will be admitted to a progressive unit bed. - Will continue her on IV Cardizem . - Will follow serial troponins. - Will obtain 2D echo. - Will continue her Eliquis. - Cardiology consult will be obtained. - I notified Dr. Custovic about the patient.

## 2024-08-07 NOTE — ED Notes (Signed)
 Pt is A&Ox4, has non-skid shoes on, bed alarm is not on because pt has a steady, even gait and doesn't need any assistive walking devices.

## 2024-08-07 NOTE — ED Provider Notes (Signed)
 [facility] Provider Note   Event Date/Time   First MD Initiated Contact with Patient 08/06/24 2258     (approximate) History  Chest Pain and Fever  HPI [name] is a 73 y.o. female with past medical history of hyperlipidemia, gallstones, paroxysmal atrial fibrillation, and depression who presents complaining of left-sided chest tightness, fevers, cough, chills, shortness of breath, urinary frequency that began today.  Patient states worsening chest pain and shortness of breath when doing anything.  Patient also reports being in A-fib recently and compliant with her Eliquis.  Patient states that she is supposed to be getting a cardioversion 4 days from now at [facility]. ROS: Patient currently denies any vision changes, tinnitus, difficulty speaking, facial droop, sore throat, abdominal pain, nausea/vomiting/diarrhea, dysuria, or weakness/numbness/paresthesias in any extremity   Physical Exam  Triage Vital Signs: ED Triage Vitals  Encounter Vitals Group     BP 08/06/24 2211 (!) 159/97     Girls Systolic BP Percentile --      Girls Diastolic BP Percentile --      Boys Systolic BP Percentile --      Boys Diastolic BP Percentile --      Pulse Rate 08/06/24 2211 (!) 130     Resp 08/06/24 2211 (!) 22     Temp 08/06/24 2211 (!) 102.4 F (39.1 C)     Temp Source 08/06/24 2211 Oral     SpO2 08/06/24 2211 95 %     Weight --      Height --      Head Circumference --      Peak Flow --      Pain Score 08/06/24 2212 5     Pain Loc --      Pain Education --      Exclude from Growth Chart --    Most recent vital signs: Vitals:   08/06/24 2300 08/07/24 0100  BP: (!) 151/95 (!) 154/97  Pulse: (!) 125 (!) 110  Resp: 16 18  Temp: (!) 101.6 F (38.7 C) 98.2 F (36.8 C)  SpO2: 95% 100%   General: Awake, oriented x4. CV:  Good peripheral perfusion.  Irregularly irregular pulse Resp:  Normal effort.  CTAB Abd:  No distention. Other:  Elderly well-developed,  well-nourished Caucasian female resting comfortably in no acute distress ED Results / Procedures / Treatments  Labs (all labs ordered are listed, but only abnormal results are displayed) Labs Reviewed  RESP PANEL BY RT-PCR (RSV, FLU A&B, COVID)  RVPGX2 - Abnormal; Notable for the following components:      Result Value   SARS Coronavirus 2 by RT PCR POSITIVE (*)    All other components within normal limits  CBC WITH DIFFERENTIAL/PLATELET - Abnormal; Notable for the following components:   WBC 3.9 (*)    All other components within normal limits  URINALYSIS, W/ REFLEX TO CULTURE (INFECTION SUSPECTED) - Abnormal; Notable for the following components:   Color, Urine STRAW (*)    APPearance CLEAR (*)    Hgb urine dipstick SMALL (*)    All other components within normal limits  GROUP A STREP BY PCR  CULTURE, BLOOD (ROUTINE X 2)  CULTURE, BLOOD (ROUTINE X 2)  EXPECTORATED SPUTUM ASSESSMENT W GRAM STAIN, RFLX TO RESP C  COMPREHENSIVE METABOLIC PANEL WITH GFR  LACTIC ACID, PLASMA  LACTIC ACID, PLASMA  PROTIME-INR  CBC  CREATININE, SERUM  BASIC METABOLIC PANEL WITH GFR  CBC  TROPONIN T, HIGH SENSITIVITY  TROPONIN T,  HIGH SENSITIVITY   EKG ED ECG REPORT I, Artist MARLA Kerns, the attending physician, personally viewed and interpreted this ECG. Date: 08/07/2024 EKG Time: 2212 Rate: 131 Rhythm: Atrial fibrillation with rapid ventricular response QRS Axis: normal Intervals: normal ST/T Wave abnormalities: normal Narrative Interpretation: Atrial fibrillation with rapid ventricular response.  No evidence of acute ischemia RADIOLOGY ED MD interpretation: Single view chest x-ray shows no evidence of acute abnormalities - All radiology independently interpreted and agree with radiology assessment Official radiology report(s): CT Angio Chest PE W/Cm &/Or Wo Cm Result Date: 08/07/2024 EXAM: CTA CHEST 08/07/2024 01:39:55 AM TECHNIQUE: CTA of the chest was performed after the administration of  intravenous contrast. Multiplanar reformatted images are provided for review. MIP images are provided for review. Automated exposure control, iterative reconstruction, and/or weight based adjustment of the mA/kV was utilized to reduce the radiation dose to as low as reasonably achievable. COMPARISON: Portable chest 08/06/2024, portable chest 07/15/2023. CLINICAL HISTORY: Pulmonary embolism (PE) suspected, high prob. Left-sided chest tightness, fevers, coughing, chills, and shortness of breath. Increasingly experiencing chest pain and shortness of breath with exertion. FINDINGS: PULMONARY ARTERIES: Pulmonary arteries are adequately opacified for evaluation. No acute pulmonary embolus. Main pulmonary artery is normal in caliber. MEDIASTINUM: There is cardiomegaly with a biatrial chamber predominance, greater prominence of the right atrium, and IVC reflux, which may be seen with right heart dysfunction or tricuspid regurgitation. There is a slightly high RV/LV ratio of 1.04 but there is no arterial dilatation or embolism. There is no substantial pericardial effusion. No visible coronary calcifications. There is a normal variant retroesophageal aberrant right subclavian artery. There is mild atherosclerosis in the aorta and great vessels without aneurysm, stenosis, or dissection. Pulmonary veins are nondilated. LYMPH NODES: No mediastinal, hilar or axillary lymphadenopathy. LUNGS AND PLEURA: There is bilateral apical reticulated scarring. There is diffuse bronchial thickening, greatest in the lower lobes, with scattered subsegmental bronchial impactions in both lower lobes. There are posterior atelectatic changes in both lungs with asymmetric posterior basal haziness in the left lower lobe, which could be asymmetric atelectasis or developing bronchopneumonia. No evidence of pleural effusion or pneumothorax. There is mild elevation of the right hemidiaphragm. UPPER ABDOMEN: The gallbladder is absent, with no acute upper  abdominal findings. SOFT TISSUES AND BONES: Bilateral breast implants with no chest wall mass. There is osteopenia, mild kyphodextroscoliosis and degenerative changes of the thoracic spine. No acute or other significant osseous findings. IMPRESSION: 1. No evidence of pulmonary embolism. 2. Cardiomegaly with biatrial chamber predominance, greater prominence of the right atrium, and IVC reflux, suggestive of right heart dysfunction or tricuspid regurgitation. Slightly high RV/LV ratio of 1.04. 3. Posterior atelectatic changes in both lungs with asymmetric posterior basal haziness in the left lower lobe, which could be asymmetric atelectasis or developing bronchopneumonia. 4. Diffuse bronchial thickening , with scattered subsegmental bronchial impactions in the lower lobes. 5. Aberrant right subclavian artery. Electronically signed by: Francis Quam MD 08/07/2024 02:06 AM EST RP Workstation: [id number]   DG Chest Port 1 View Result Date: 08/06/2024 CLINICAL DATA:  Chest pain EXAM: PORTABLE CHEST 1 VIEW COMPARISON:  Chest x-ray 07/15/2023 FINDINGS: The heart size and mediastinal contours are within normal limits. Both lungs are clear. The visualized skeletal structures are unremarkable. IMPRESSION: No active disease. Electronically Signed   By: Greig Pique M.D.   On: 08/06/2024 23:04   PROCEDURES: Critical Care performed: Yes, see critical care procedure note(s) .1-3 Lead EKG Interpretation  Performed by: Kerns Artist MARLA, MD Authorized by: Karrina Lye  K, MD     Interpretation: abnormal     ECG rate:  121   ECG rate assessment: tachycardic     Rhythm: atrial fibrillation     Ectopy: none     Conduction: normal   CRITICAL CARE Performed by: Aiesha Leland K Enzley Kitchens  Total critical care time: 37 minutes  Critical care time was exclusive of separately billable procedures and treating other patients.  Critical care was necessary to treat or prevent imminent or life-threatening deterioration.  Critical  care was time spent personally by me on the following activities: development of treatment plan with patient and/or surrogate as well as nursing, discussions with consultants, evaluation of patient's response to treatment, examination of patient, obtaining history from patient or surrogate, ordering and performing treatments and interventions, ordering and review of laboratory studies, ordering and review of radiographic studies, pulse oximetry and re-evaluation of patient's condition.  MEDICATIONS ORDERED IN ED: Medications  vancomycin (VANCOCIN) IVPB 1000 mg/200 mL premix (1,000 mg Intravenous Not Given 08/07/24 0128)  diltiazem  (CARDIZEM ) 125 mg in dextrose  5% 125 mL (1 mg/mL) infusion (7.5 mg/hr Intravenous Infusion Verify 08/07/24 0212)  cefTRIAXone (ROCEPHIN) 2 g in sodium chloride  0.9 % 100 mL IVPB (has no administration in time range)  azithromycin (ZITHROMAX) 500 mg in sodium chloride  0.9 % 250 mL IVPB (has no administration in time range)  enoxaparin (LOVENOX) injection 40 mg (has no administration in time range)  0.9 %  sodium chloride  infusion (has no administration in time range)  ondansetron  (ZOFRAN ) tablet 4 mg (has no administration in time range)    Or  ondansetron  (ZOFRAN ) injection 4 mg (has no administration in time range)  magnesium  hydroxide (MILK OF MAGNESIA) suspension 30 mL (has no administration in time range)  traZODone (DESYREL) tablet 25 mg (has no administration in time range)  acetaminophen  (TYLENOL ) tablet 650 mg (has no administration in time range)    Or  acetaminophen  (TYLENOL ) suppository 650 mg (has no administration in time range)  acetaminophen  (TYLENOL ) tablet 650 mg (650 mg Oral Given 08/06/24 2217)  ceFEPIme (MAXIPIME) 2 g in sodium chloride  0.9 % 100 mL IVPB (0 g Intravenous Stopped 08/06/24 2340)  metroNIDAZOLE (FLAGYL) IVPB 500 mg (0 mg Intravenous Stopped 08/07/24 0057)  lactated ringers  bolus 1,000 mL (0 mLs Intravenous Stopped 08/07/24 0105)  lactated  ringers  bolus 1,000 mL (1,000 mLs Intravenous New Bag/Given 08/07/24 0041)  iohexol  (OMNIPAQUE ) 350 MG/ML injection 75 mL (75 mLs Intravenous Contrast Given 08/07/24 0120)   IMPRESSION / MDM / ASSESSMENT AND PLAN / ED COURSE  I reviewed the triage vital signs and the nursing notes.                             The patient is on the cardiac monitor to evaluate for evidence of arrhythmia and/or significant heart rate changes. Patient's presentation is most consistent with acute presentation with potential threat to life or bodily function. Patient is a 73 year old female who presents for a constellation of symptoms including chest tightness, fever, cough, chills, shortness of breath, and urinary frequency DDx: Urosepsis, pneumonia, COVID infection, influenza Plan: Sepsis bundle initiated IVF: 2 L LR ABX: Cefepime, Flagyl, vancomycin  Given patient's significant dyspnea on exertion, chest pain, and atrial fibrillation with rapid ventricular response requiring IV rate control, patient will require admission to the internal medicine service for further evaluation and management.  Dispo: Admit to medicine   FINAL CLINICAL IMPRESSION(S) / ED DIAGNOSES  Final diagnoses:  COVID-19 virus infection  Atrial fibrillation with RVR (HCC)  Chest pain, unspecified type  Shortness of breath  Dyspnea on exertion   Rx / DC Orders   ED Discharge Orders     None      Note:  This document was prepared using Dragon voice recognition software and may include unintentional dictation errors.   Jossie Artist POUR, MD 08/07/24 [phone]

## 2024-08-07 NOTE — Consult Note (Signed)
 [facility] CLINIC CARDIOLOGY CONSULT NOTE       Patient ID: [name] MRN: [id number] DOB/AGE: 11/04/1950 73 y.o.  Admit date: 08/06/2024 Referring Physician Dr. Madison Peaches Primary Physician Rudolpho Norleen BIRCH, MD  Primary Cardiologist Jeoffrey Ano, NP ([facility]) Reason for Consultation AFL RVR  HPI: [name] is a 73 y.o. female  with a past medical history of atrial fibrillation s/p ablation 11/2023, no significant CAD on [facility] 04/2024, prediabetes, hx migraines who presented to the ED on 08/06/2024 for chest pain and fever. Cardiology was consulted for further evaluation.   Yesterday evening she had onset of fever/chills, chest tightness. Had palpitations like AF. Given these symptoms she came in for evaluation. Workup in the ED notable for creatinine 0.70, potassium 4.1, hemoglobin 14.0, WBC 3.9. Troponins <15 x2. EKG in the ED atrial flutter 131 bpm. Started on IV diltiazem . Also found to be COVID positive and CXR with pneumonia.   Patient seen and examined this morning, resting in hospital bed. We discussed her symptoms in further detail. States recent onset of palpitation symptoms like when she was in atrial fibrillation previously. Also with fever and chills which were new. With the palpitations she experienced chest tightness. Overall is feeling better today.   Review of systems complete and found to be negative unless listed above    Past Medical History:  Diagnosis Date   Adenomatous colon polyp    Allergy    SEASONAL   Complication of anesthesia    reports that she has given a medication that mades her relax but it make her anxious, requesting not to have this medication, does not know the name but it is given through the IV.   Depression    Dysrhythmia    AFIB   Gallstones    Headache    Hyperlipidemia    Osteoarthritis    Pneumonia     Past Surgical History:  Procedure Laterality Date   ABDOMINAL HYSTERECTOMY     APPENDECTOMY     BREAST ENHANCEMENT SURGERY      CARPOMETACARPAL (CMC) FUSION OF THUMB Right 04/02/2021   Procedure: REVISION SUSPENSION ARTHROPLASTY OF RIGHT THUMB [facility] JOINT.;  Surgeon: Edie Norleen JINNY, MD;  Location: [facility] ORS;  Service: Orthopedics;  Laterality: Right;   CATARACT EXTRACTION W/ INTRAOCULAR LENS  IMPLANT, BILATERAL  01/06/2015   CHOLECYSTECTOMY  09/07/1988   COLONOSCOPY     KNEE ARTHROSCOPY     right   partial vaginal hysterectomy     REPAIR EXTENSOR TENDON Left 10/07/2022   Procedure: 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.;  Surgeon: Edie Norleen JINNY, MD;  Location: [facility] ORS;  Service: Orthopedics;  Laterality: Left;   THUMB ARTHROSCOPY     bilateral   TONSILLECTOMY      (Not in a hospital admission)  Social History   Socioeconomic History   Marital status: Married    Spouse name: Jeralyn   Number of children: 4   Years of education: Not on file   Highest education level: Not on file  Occupational History   Occupation: housewife  Tobacco Use   Smoking status: Never   Smokeless tobacco: Never   Tobacco comments:    Tried about 2x with cousin at age 64  Substance and Sexual Activity   Alcohol use: Yes    Comment: rarely   Drug use: No   Sexual activity: Not on file  Other Topics Concern   Not on file  Social History Narrative   Not  on file   Social Drivers of Health   Financial Resource Strain: Low Risk  (04/04/2024)   Received from [facility]   Overall Financial Resource Strain (CARDIA)    Difficulty of Paying Living Expenses: Not hard at all  Food Insecurity: No Food Insecurity (04/04/2024)   Received from [facility]   Hunger Vital Sign    Within the past 12 months, you worried that your food would run out before you got the money to buy more.: Never true    Within the past 12 months, the food you bought just didn't last and you didn't have money to get more.: Never true  Transportation Needs: No Transportation Needs (04/04/2024)   Received from  [facility] - Transportation    In the past 12 months, has lack of transportation kept you from medical appointments or from getting medications?: No    Lack of Transportation (Non-Medical): No  Physical Activity: Not on file  Stress: Not on file  Social Connections: Not on file  Intimate Partner Violence: Not on file    Family History  Problem Relation Age of Onset   Prostate cancer Father    Diabetes Father    Heart disease Father    Hypertension Mother    Stroke Mother    Heart attack Paternal Grandfather    Dementia Paternal Grandmother    Ulcers Maternal Grandfather        bleeding   Anuerysm Maternal Grandfather        AAA     Vitals:   08/07/24 0530 08/07/24 0600 08/07/24 0630 08/07/24 0700  BP: 132/70 133/75 (!) 120/92 128/76  Pulse: 63 67 81 60  Resp: 15 (!) 22 17 17   Temp:   98.4 F (36.9 C)   TempSrc:   Oral   SpO2: 100% 96% 99% 94%  Weight:      Height:        PHYSICAL EXAM General: Well appearing female, well nourished, in no acute distress. HEENT: Normocephalic and atraumatic. Neck: No JVD.  Lungs: Normal respiratory effort on room air. Clear bilaterally to auscultation. No wheezes, crackles, rhonchi.  Heart: Irregularly irregular, controlled rate. Normal S1 and S2 without gallops or murmurs.  Abdomen: Non-distended appearing.  Msk: Normal strength and tone for age. Extremities: Warm and well perfused. No clubbing, cyanosis. No edema.  Neuro: Alert and oriented X 3. Psych: Answers questions appropriately.   Labs: Basic Metabolic Panel: Recent Labs    08/06/24 2219 08/07/24 0348  NA 140 138  K 4.1 4.0  CL 101 104  CO2 27 23  GLUCOSE 94 97  BUN 13 9  CREATININE 0.70 0.58  CALCIUM 9.4 8.6*   Liver Function Tests: Recent Labs    08/06/24 2219  AST 27  ALT 23  ALKPHOS 70  BILITOT 0.4  PROT 7.1  ALBUMIN 4.4   No results for input(s): LIPASE, AMYLASE in the last 72 hours. CBC: Recent Labs     08/06/24 2219 08/07/24 0348  WBC 3.9* 4.2  NEUTROABS 2.6  --   HGB 14.0 12.7  HCT 40.8 37.4  MCV 92.7 94.2  PLT 151 136*   Cardiac Enzymes: No results for input(s): CKTOTAL, CKMB, CKMBINDEX, TROPONINIHS in the last 72 hours. BNP: No results for input(s): BNP in the last 72 hours. D-Dimer: No results for input(s): DDIMER in the last 72 hours. Hemoglobin A1C: No results for input(s): HGBA1C in the last 72 hours. Fasting Lipid  Panel: No results for input(s): CHOL, HDL, LDLCALC, TRIG, CHOLHDL, LDLDIRECT in the last 72 hours. Thyroid  Function Tests: No results for input(s): TSH, T4TOTAL, T3FREE, THYROIDAB in the last 72 hours.  Invalid input(s): FREET3 Anemia Panel: No results for input(s): VITAMINB12, FOLATE, FERRITIN, TIBC, IRON, RETICCTPCT in the last 72 hours.   Radiology: CT Angio Chest PE W/Cm &/Or Wo Cm Result Date: 08/07/2024 EXAM: CTA CHEST 08/07/2024 01:39:55 AM TECHNIQUE: CTA of the chest was performed after the administration of intravenous contrast. Multiplanar reformatted images are provided for review. MIP images are provided for review. Automated exposure control, iterative reconstruction, and/or weight based adjustment of the mA/kV was utilized to reduce the radiation dose to as low as reasonably achievable. COMPARISON: Portable chest 08/06/2024, portable chest 07/15/2023. CLINICAL HISTORY: Pulmonary embolism (PE) suspected, high prob. Left-sided chest tightness, fevers, coughing, chills, and shortness of breath. Increasingly experiencing chest pain and shortness of breath with exertion. FINDINGS: PULMONARY ARTERIES: Pulmonary arteries are adequately opacified for evaluation. No acute pulmonary embolus. Main pulmonary artery is normal in caliber. MEDIASTINUM: There is cardiomegaly with a biatrial chamber predominance, greater prominence of the right atrium, and IVC reflux, which may be seen with right heart dysfunction or  tricuspid regurgitation. There is a slightly high RV/LV ratio of 1.04 but there is no arterial dilatation or embolism. There is no substantial pericardial effusion. No visible coronary calcifications. There is a normal variant retroesophageal aberrant right subclavian artery. There is mild atherosclerosis in the aorta and great vessels without aneurysm, stenosis, or dissection. Pulmonary veins are nondilated. LYMPH NODES: No mediastinal, hilar or axillary lymphadenopathy. LUNGS AND PLEURA: There is bilateral apical reticulated scarring. There is diffuse bronchial thickening, greatest in the lower lobes, with scattered subsegmental bronchial impactions in both lower lobes. There are posterior atelectatic changes in both lungs with asymmetric posterior basal haziness in the left lower lobe, which could be asymmetric atelectasis or developing bronchopneumonia. No evidence of pleural effusion or pneumothorax. There is mild elevation of the right hemidiaphragm. UPPER ABDOMEN: The gallbladder is absent, with no acute upper abdominal findings. SOFT TISSUES AND BONES: Bilateral breast implants with no chest wall mass. There is osteopenia, mild kyphodextroscoliosis and degenerative changes of the thoracic spine. No acute or other significant osseous findings. IMPRESSION: 1. No evidence of pulmonary embolism. 2. Cardiomegaly with biatrial chamber predominance, greater prominence of the right atrium, and IVC reflux, suggestive of right heart dysfunction or tricuspid regurgitation. Slightly high RV/LV ratio of 1.04. 3. Posterior atelectatic changes in both lungs with asymmetric posterior basal haziness in the left lower lobe, which could be asymmetric atelectasis or developing bronchopneumonia. 4. Diffuse bronchial thickening , with scattered subsegmental bronchial impactions in the lower lobes. 5. Aberrant right subclavian artery. Electronically signed by: Francis Quam MD 08/07/2024 02:06 AM EST RP Workstation: [id number]    DG Chest Port 1 View Result Date: 08/06/2024 CLINICAL DATA:  Chest pain EXAM: PORTABLE CHEST 1 VIEW COMPARISON:  Chest x-ray 07/15/2023 FINDINGS: The heart size and mediastinal contours are within normal limits. Both lungs are clear. The visualized skeletal structures are unremarkable. IMPRESSION: No active disease. Electronically Signed   By: Greig Pique M.D.   On: 08/06/2024 23:04    ECHO 04/2024 echo stress at [facility] with preserved EF  TELEMETRY (personally reviewed): atrial flutter variable block rate 80s  EKG (personally reviewed): atrial flutter 131 bpm  Data reviewed by me 08/07/2024: last 24h vitals tele labs imaging I/O ED provider note, admission H&P  Principal Problem:   Atrial fibrillation with  rapid ventricular response (HCC) Active Problems:   Bronchopneumonia   Dyslipidemia   Glaucoma   COVID-19 virus infection    ASSESSMENT AND PLAN:  [name] is a 73 y.o. female  with a past medical history of atrial fibrillation s/p ablation 11/2023, no significant CAD on [facility] 04/2024, prediabetes, hx migraines who presented to the ED on 08/06/2024 for chest pain and fever. Cardiology was consulted for further evaluation.   # Atrial flutter RVR # Hx atrial fibrillation/flutter # Pneumonia # COVID infection HR elevated in setting of pneumonia and COVID. Improved with IVF and antibiotics.  -Resume home flecainide 75 mg bid. Increase metoprolol  to 25 mg BID. Primary cardiologist at [facility] is setting her up for outpatient cardioversion followed by ablation. Recommend rate control strategy at this time until this is done outpatient. Would not recommend pursuing cardioversion inpatient given acute COVID infection and pneumonia.  -Continue eliquis 5 mg twice daily for stroke risk reduction.  -Further management of pneumonia, COVID per primary team.  This patient's plan of care was discussed and created with Dr. Florencio and he is in agreement.  Signed: Danita Bloch, PA-C   08/07/2024, 7:35 AM [facility] Cardiology

## 2024-08-08 DIAGNOSIS — I4891 Unspecified atrial fibrillation: Secondary | ICD-10-CM | POA: Diagnosis not present

## 2024-08-08 LAB — CBC
HCT: 37.8 % (ref 36.0–46.0)
Hemoglobin: 12.8 g/dL (ref 12.0–15.0)
MCH: 32.2 pg (ref 26.0–34.0)
MCHC: 33.9 g/dL (ref 30.0–36.0)
MCV: 95.2 fL (ref 80.0–100.0)
Platelets: 140 K/uL — ABNORMAL LOW (ref 150–400)
RBC: 3.97 MIL/uL (ref 3.87–5.11)
RDW: 12.7 % (ref 11.5–15.5)
WBC: 3.6 K/uL — ABNORMAL LOW (ref 4.0–10.5)
nRBC: 0 % (ref 0.0–0.2)

## 2024-08-08 LAB — BASIC METABOLIC PANEL WITH GFR
Anion gap: 9 (ref 5–15)
BUN: 10 mg/dL (ref 8–23)
CO2: 26 mmol/L (ref 22–32)
Calcium: 8.1 mg/dL — ABNORMAL LOW (ref 8.9–10.3)
Chloride: 105 mmol/L (ref 98–111)
Creatinine, Ser: 0.64 mg/dL (ref 0.44–1.00)
GFR, Estimated: >60 mL/min (ref >60)
Glucose, Bld: 87 mg/dL (ref 70–99)
Potassium: 3.5 mmol/L (ref 3.5–5.1)
Sodium: 139 mmol/L (ref 135–145)

## 2024-08-08 LAB — EXPECTORATED SPUTUM ASSESSMENT W GRAM STAIN, RFLX TO RESP C

## 2024-08-08 MED ORDER — AZITHROMYCIN 250 MG PO TABS: 500.0000 mg | ORAL_TABLET | Freq: Every day | ORAL | Status: DC

## 2024-08-08 MED ORDER — METOPROLOL SUCCINATE ER 25 MG PO TB24: 25.0000 mg | ORAL_TABLET | Freq: Two times a day (BID) | ORAL | 0 refills | Status: AC

## 2024-08-08 MED ORDER — HYDROCOD POLI-CHLORPHE POLI ER 10-8 MG/5ML PO SUER: 5.0000 mL | Freq: Two times a day (BID) | ORAL | 0 refills | Status: AC | PRN

## 2024-08-08 NOTE — Progress Notes (Signed)
 [facility] CLINIC CARDIOLOGY PROGRESS NOTE       Patient ID: [name] MRN: [id number] DOB/AGE: 1951/09/04 73 y.o.  Admit date: 08/06/2024 Referring Physician Dr. Madison Peaches Primary Physician Rudolpho Norleen BIRCH, MD  Primary Cardiologist Jeoffrey Ano, NP ([facility]) Reason for Consultation AFL RVR  HPI: [name] is a 74 y.o. female  with a past medical history of atrial fibrillation s/p ablation 11/2023, no significant CAD on [facility] 04/2024, prediabetes, hx migraines who presented to the ED on 08/06/2024 for chest pain and fever. Cardiology was consulted for further evaluation.   Interval history: -Patient seen and examined this AM, resting in hospital bed.  -Denies any palpitations, CP. States SOB is somewhat improved. Mild cough but not requiring any supplemental O2.  -HR around 100 bpm this AM, better overnight.  Review of systems complete and found to be negative unless listed above    Past Medical History:  Diagnosis Date   Adenomatous colon polyp    Allergy    SEASONAL   Complication of anesthesia    reports that she has given a medication that mades her relax but it make her anxious, requesting not to have this medication, does not know the name but it is given through the IV.   Depression    Dysrhythmia    AFIB   Gallstones    Headache    Hyperlipidemia    Osteoarthritis    Pneumonia     Past Surgical History:  Procedure Laterality Date   ABDOMINAL HYSTERECTOMY     APPENDECTOMY     BREAST ENHANCEMENT SURGERY     CARPOMETACARPAL (CMC) FUSION OF THUMB Right 04/02/2021   Procedure: REVISION SUSPENSION ARTHROPLASTY OF RIGHT THUMB [facility].;  Surgeon: Edie Norleen JINNY, MD;  Location: [facility] ORS;  Service: Orthopedics;  Laterality: Right;   CATARACT EXTRACTION W/ INTRAOCULAR LENS  IMPLANT, BILATERAL  01/06/2015   CHOLECYSTECTOMY  09/07/1988   COLONOSCOPY     KNEE ARTHROSCOPY     right   partial vaginal hysterectomy     REPAIR EXTENSOR TENDON Left 10/07/2022    Procedure: 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   Unknown   apixaban (ELIQUIS) 5 MG TABS tablet Take 5 mg by mouth 2 (two) times daily.   08/06/2024 Morning   brimonidine (ALPHAGAN) 0.2 % ophthalmic solution Place 1 drop into the right eye 2 (two) times daily.   08/06/2024   COLLAGEN PO Take 1 Scoop by mouth every evening. Multi Collagen Protein powder   08/06/2024 Evening   doxycycline (VIBRA-TABS) 100 MG tablet Take 100 mg by mouth 2 (two) times daily.   Past Month   estrogens, conjugated, (PREMARIN) 0.625 MG tablet Take 0.625 mg by mouth.   08/06/2024   flecainide (TAMBOCOR) 50 MG tablet Take 75 mg by mouth 2 (two) times daily.   08/06/2024   ketorolac (ACULAR) 0.5 % ophthalmic solution Place 1 drop into the right eye in the morning and at bedtime.   08/06/2024   loperamide (IMODIUM A-D) 2 MG tablet Take 2-4 mg by mouth 4 (four) times daily as needed for diarrhea or loose stools.  Unknown   metoprolol  succinate (TOPROL -XL) 25 MG 24 hr tablet 25 mg daily.   08/06/2024   Omega-3 1000 MG CAPS Take 1,000 mg by mouth daily.   08/06/2024   prednisoLONE  acetate (PRED FORTE ) 1 % ophthalmic suspension Place 1 drop into the right eye 2 (two) times daily. (Patient not taking: Reported on 08/07/2024)   Not Taking   triamcinolone  ointment (KENALOG ) 0.5 % Apply topically. (Patient not taking: Reported on 08/07/2024)   Not Taking   Social History   Socioeconomic History   Marital status: Married    Spouse name: Jeralyn   Number of children: 4   Years of education: Not on file   Highest education level: Not on file  Occupational History   Occupation: housewife   Tobacco Use   Smoking status: Never   Smokeless tobacco: Never   Tobacco comments:    Tried about 2x with cousin at age 60  Substance and Sexual Activity   Alcohol use: Yes    Comment: rarely   Drug use: No   Sexual activity: Not on file  Other Topics Concern   Not on file  Social History Narrative   Not on file   Social Drivers of Health   Financial Resource Strain: Low Risk  (04/04/2024)   Received from [facility]   Overall Financial Resource Strain (CARDIA)    Difficulty of Paying Living Expenses: Not hard at all  Food Insecurity: No Food Insecurity (08/07/2024)   Hunger Vital Sign    Worried About Running Out of Food in the Last Year: Never true    Ran Out of Food in the Last Year: Never true  Transportation Needs: No Transportation Needs (08/07/2024)   PRAPARE - Administrator, Civil Service (Medical): No    Lack of Transportation (Non-Medical): No  Physical Activity: Not on file  Stress: Not on file  Social Connections: Moderately Integrated (08/07/2024)   Social Connection and Isolation Panel    Frequency of Communication with Friends and Family: More than three times a week    Frequency of Social Gatherings with Friends and Family: More than three times a week    Attends Religious Services: More than 4 times per year    Active Member of Clubs or Organizations: No    Attends Banker Meetings: Never    Marital Status: Married  Catering Manager Violence: Not At Risk (08/07/2024)   Humiliation, Afraid, Rape, and Kick questionnaire    Fear of Current or Ex-Partner: No    Emotionally Abused: No    Physically Abused: No    Sexually Abused: No    Family History  Problem Relation Age of Onset   Prostate cancer Father    Diabetes Father    Heart disease Father    Hypertension Mother    Stroke Mother    Heart attack Paternal Grandfather    Dementia Paternal Grandmother    Ulcers Maternal Grandfather        bleeding    Anuerysm Maternal Grandfather        AAA     Vitals:   08/07/24 2118 08/08/24 0000 08/08/24 0322 08/08/24 0817  BP:  130/76 123/82 (!) 129/90  Pulse: 75 77 68 77  Resp:  19 18 16   Temp:  98.6 F (37 C) 97.7 F (36.5 C) 98.2 F (36.8 C)  TempSrc:      SpO2:  96% 95% 92%  Weight:      Height:  PHYSICAL EXAM General: Well appearing female, well nourished, in no acute distress. HEENT: Normocephalic and atraumatic. Neck: No JVD.  Lungs: Normal respiratory effort on room air. Clear bilaterally to auscultation. No wheezes, crackles, rhonchi.  Heart: Irregularly irregular, controlled rate. Normal S1 and S2 without gallops or murmurs.  Abdomen: Non-distended appearing.  Msk: Normal strength and tone for age. Extremities: Warm and well perfused. No clubbing, cyanosis. No edema.  Neuro: Alert and oriented X 3. Psych: Answers questions appropriately.   Labs: Basic Metabolic Panel: Recent Labs    08/07/24 0348 08/08/24 0417  NA 138 139  K 4.0 3.5  CL 104 105  CO2 23 26  GLUCOSE 97 87  BUN 9 10  CREATININE 0.58 0.64  CALCIUM 8.6* 8.1*   Liver Function Tests: Recent Labs    08/06/24 2219  AST 27  ALT 23  ALKPHOS 70  BILITOT 0.4  PROT 7.1  ALBUMIN 4.4   No results for input(s): LIPASE, AMYLASE in the last 72 hours. CBC: Recent Labs    08/06/24 2219 08/07/24 0348 08/08/24 0417  WBC 3.9* 4.2 3.6*  NEUTROABS 2.6  --   --   HGB 14.0 12.7 12.8  HCT 40.8 37.4 37.8  MCV 92.7 94.2 95.2  PLT 151 136* 140*   Cardiac Enzymes: No results for input(s): CKTOTAL, CKMB, CKMBINDEX, TROPONINIHS in the last 72 hours. BNP: No results for input(s): BNP in the last 72 hours. D-Dimer: No results for input(s): DDIMER in the last 72 hours. Hemoglobin A1C: No results for input(s): HGBA1C in the last 72 hours. Fasting Lipid Panel: No results for input(s): CHOL, HDL, LDLCALC, TRIG, CHOLHDL, LDLDIRECT in the last 72 hours. Thyroid  Function  Tests: No results for input(s): TSH, T4TOTAL, T3FREE, THYROIDAB in the last 72 hours.  Invalid input(s): FREET3 Anemia Panel: No results for input(s): VITAMINB12, FOLATE, FERRITIN, TIBC, IRON, RETICCTPCT in the last 72 hours.   Radiology: CT Angio Chest PE W/Cm &/Or Wo Cm Result Date: 08/07/2024 EXAM: CTA CHEST 08/07/2024 01:39:55 AM TECHNIQUE: CTA of the chest was performed after the administration of intravenous contrast. Multiplanar reformatted images are provided for review. MIP images are provided for review. Automated exposure control, iterative reconstruction, and/or weight based adjustment of the mA/kV was utilized to reduce the radiation dose to as low as reasonably achievable. COMPARISON: Portable chest 08/06/2024, portable chest 07/15/2023. CLINICAL HISTORY: Pulmonary embolism (PE) suspected, high prob. Left-sided chest tightness, fevers, coughing, chills, and shortness of breath. Increasingly experiencing chest pain and shortness of breath with exertion. FINDINGS: PULMONARY ARTERIES: Pulmonary arteries are adequately opacified for evaluation. No acute pulmonary embolus. Main pulmonary artery is normal in caliber. MEDIASTINUM: There is cardiomegaly with a biatrial chamber predominance, greater prominence of the right atrium, and IVC reflux, which may be seen with right heart dysfunction or tricuspid regurgitation. There is a slightly high RV/LV ratio of 1.04 but there is no arterial dilatation or embolism. There is no substantial pericardial effusion. No visible coronary calcifications. There is a normal variant retroesophageal aberrant right subclavian artery. There is mild atherosclerosis in the aorta and great vessels without aneurysm, stenosis, or dissection. Pulmonary veins are nondilated. LYMPH NODES: No mediastinal, hilar or axillary lymphadenopathy. LUNGS AND PLEURA: There is bilateral apical reticulated scarring. There is diffuse bronchial thickening, greatest in the  lower lobes, with scattered subsegmental bronchial impactions in both lower lobes. There are posterior atelectatic changes in both lungs with asymmetric posterior basal haziness in the left lower lobe, which could be asymmetric atelectasis or developing bronchopneumonia.  No evidence of pleural effusion or pneumothorax. There is mild elevation of the right hemidiaphragm. UPPER ABDOMEN: The gallbladder is absent, with no acute upper abdominal findings. SOFT TISSUES AND BONES: Bilateral breast implants with no chest wall mass. There is osteopenia, mild kyphodextroscoliosis and degenerative changes of the thoracic spine. No acute or other significant osseous findings. IMPRESSION: 1. No evidence of pulmonary embolism. 2. Cardiomegaly with biatrial chamber predominance, greater prominence of the right atrium, and IVC reflux, suggestive of right heart dysfunction or tricuspid regurgitation. Slightly high RV/LV ratio of 1.04. 3. Posterior atelectatic changes in both lungs with asymmetric posterior basal haziness in the left lower lobe, which could be asymmetric atelectasis or developing bronchopneumonia. 4. Diffuse bronchial thickening , with scattered subsegmental bronchial impactions in the lower lobes. 5. Aberrant right subclavian artery. Electronically signed by: Francis Quam MD 08/07/2024 02:06 AM EST RP Workstation: [id number]   DG Chest Port 1 View Result Date: 08/06/2024 CLINICAL DATA:  Chest pain EXAM: PORTABLE CHEST 1 VIEW COMPARISON:  Chest x-ray 07/15/2023 FINDINGS: The heart size and mediastinal contours are within normal limits. Both lungs are clear. The visualized skeletal structures are unremarkable. IMPRESSION: No active disease. Electronically Signed   By: Greig Pique M.D.   On: 08/06/2024 23:04    ECHO 04/2024 echo stress at [facility] with preserved EF  TELEMETRY (personally reviewed): atrial flutter variable block rate 100s  EKG (personally reviewed): atrial flutter 131 bpm  Data reviewed by me  08/08/2024: last 24h vitals tele labs imaging I/O ED provider note, admission H&P, hospitalist progress note  Principal Problem:   Atrial fibrillation with rapid ventricular response (HCC) Active Problems:   Bronchopneumonia   Dyslipidemia   Glaucoma   COVID-19 virus infection   Atrial fibrillation (HCC)    ASSESSMENT AND PLAN:  [name] is a 73 y.o. female  with a past medical history of atrial fibrillation s/p ablation 11/2023, no significant CAD on [facility] 04/2024, prediabetes, hx migraines who presented to the ED on 08/06/2024 for chest pain and fever. Cardiology was consulted for further evaluation.   # Atrial flutter RVR # Hx atrial fibrillation/flutter # Pneumonia # COVID infection HR elevated in setting of pneumonia and COVID. Improved with IVF and antibiotics.  -Continue home flecainide 75 mg bid. Continue metoprolol  25 mg BID. Goal HR will be <110 given underlying infections. Primary cardiologist at [facility] is setting her up for outpatient cardioversion on 12/15 followed by ablation. Recommend rate control strategy at this time until this is done outpatient. Would not recommend pursuing cardioversion inpatient given acute COVID infection and pneumonia.  -Continue to treat causes of increased adrenergic tone including fever, infection, hypoxia, pain, etc.   -Continue eliquis 5 mg twice daily for stroke risk reduction.  -Further management of pneumonia, COVID per primary team.  Ok for discharge today from a cardiac perspective. Proceed with scheduled cardioversion on 12/15 at [facility].  This patient's plan of care was discussed and created with Dr. Florencio and he is in agreement.  Signed: Danita Bloch, PA-C  08/08/2024, 9:10 AM [facility] Cardiology

## 2024-08-08 NOTE — Discharge Summary (Signed)
 Physician Discharge Summary  [name] FMW:[id number] DOB: Jun 08, 1951 DOA: 08/06/2024  PCP: Rudolpho Norleen BIRCH, MD  Admit date: 08/06/2024 Discharge date: 08/08/2024  Admitted From: home  Disposition:  home   Recommendations for Outpatient Follow-up:  Follow up with PCP in 1-2 weeks F/u w/ [facility] cardio at previously scheduled appt   Home Health: no  Equipment/Devices:  Discharge Condition: stable  CODE STATUS:full  Diet recommendation:  Regular  Brief/Interim Summary: HPI was taken from Dr. Lawence: [name] is a 73 y.o. Caucasian female with medical history significant for depression, paroxysmal atrial fibrillation, dyslipidemia and osteoarthritis, who presented to the emergency room with acute onset of chills that started around 3 PM yesterday followed by fever with a Tmax of 102.6 after having dinner.  She became tachycardic to 130.  She admitted to mild headache with dizziness as well as cough with inability to expectorate as with dyspnea without wheezing.  She admitted to left-sided chest tightness with her dyspnea and palpitations.  Her tightness was radiating to her left shoulder and intermittent.  No loss of taste or smell.  She had occasional nausea without vomiting or diarrhea.  No dysuria, oliguria or hematuria or flank pain.   ED Course: When she came to the ER, temperature was 101.6 and heart rate was 126 with a BP 151/95 approximately 95% on room air and respiratory rate 16 and later 24.  Labs revealed unremarkable CMP.  CBC showed WBCs of 3.9.  Respiratory panel came back positive for COVID-19.  UA was unremarkable.  2 blood cultures were drawn.  Group A strep PCR came back negative. EKG as reviewed by me : EKG shows A-fib flutter with a rate of 131 with left axis deviation and low voltage QRS with Q waves anteroseptally. Imaging: Chest x-ray showed no acute cardiopulmonary disease.   CTA of the chest revealed the following: 1. No evidence of pulmonary  embolism. 2. Cardiomegaly with biatrial chamber predominance, greater prominence of the right atrium, and IVC reflux, suggestive of right heart dysfunction or tricuspid regurgitation. Slightly high RV/LV ratio of 1.04. 3. Posterior atelectatic changes in both lungs with asymmetric posterior basal haziness in the left lower lobe, which could be asymmetric atelectasis or developing bronchopneumonia. 4. Diffuse bronchial thickening , with scattered subsegmental bronchial impactions in the lower lobes. 5. Aberrant right subclavian artery.   The patient was given IV vancomycin, cefepime and Flagyl and 2 L bolus of IV lactated Ringer as well as IV Cardizem  and 650 mg p.o. Tylenol .  She will be admitted to a progressive unit bed for further evaluation and management.  Discharge Diagnoses:  Principal Problem:   Atrial fibrillation with rapid ventricular response (HCC) Active Problems:   Bronchopneumonia   COVID-19 virus infection   Dyslipidemia   Glaucoma   Atrial fibrillation (HCC)  PAF: d/c IV cardizem  as per cardio. Continue on flecainide, increased metoprolol  dose, & eliquis as per cardio. Continue on tele. Cardio following and recs apprec    Pneumonia: secondary to COVID19. D/c rocephin, vanco. Tussionex prn. Continue w/ supportive care. No need for steroids as per is not currently requiring supplemental oxygen    COVID-19 virus infection: continue w/ supportive care. Continue on airbone and contact precautions    HLD: not on a statin as per med rec   Glaucoma: continue on home eye drops   Thrombocytopenia: etiology unclear, possibly secondary to COVID19. Trending up   Discharge Instructions  Discharge Instructions     Diet general   Complete  by: As directed    Discharge instructions   Complete by: As directed    F/u w/ PCP in 1-2 weeks. F/u w/ [facility] cardiology at previously scheduled appointment   Increase activity slowly   Complete by: As directed       Allergies as of  08/08/2024       Reactions   Naproxen Other (See Comments)   headache   Lidocaine  Hives   Amoxicillin -pot Clavulanate Diarrhea   Celecoxib Other (See Comments)   Gentamicin Other (See Comments)   Peeled away corneas   Gluten Meal Diarrhea, Other (See Comments)   Abdominal cramps with enriched wheat flour   Lactose Diarrhea, Other (See Comments)   Other Other (See Comments)   Flu vaccine-chills/sweats    Ultram  [tramadol ] Other (See Comments)   insomnia   Meloxicam Palpitations   heart palpitations        Medication List     STOP taking these medications    prednisoLONE acetate 1 % ophthalmic suspension Commonly known as: PRED FORTE   triamcinolone  ointment 0.5 % Commonly known as: KENALOG        TAKE these medications    acetaminophen  500 MG tablet Commonly known as: TYLENOL  Take 500 mg by mouth every 6 (six) hours as needed (pain).   apixaban 5 MG Tabs tablet Commonly known as: ELIQUIS Take 5 mg by mouth 2 (two) times daily.   brimonidine 0.2 % ophthalmic solution Commonly known as: ALPHAGAN Place 1 drop into the right eye 2 (two) times daily.   chlorpheniramine-HYDROcodone 10-8 MG/5ML Commonly known as: TUSSIONEX Take 5 mLs by mouth every 12 (twelve) hours as needed for up to 14 days for cough.   COLLAGEN PO Take 1 Scoop by mouth every evening. Multi Collagen Protein powder   doxycycline 100 MG tablet Commonly known as: VIBRA-TABS Take 100 mg by mouth 2 (two) times daily.   estrogens (conjugated) 0.625 MG tablet Commonly known as: PREMARIN Take 0.625 mg by mouth.   flecainide 50 MG tablet Commonly known as: TAMBOCOR Take 75 mg by mouth 2 (two) times daily.   ketorolac 0.5 % ophthalmic solution Commonly known as: ACULAR Place 1 drop into the right eye in the morning and at bedtime.   loperamide 2 MG tablet Commonly known as: IMODIUM A-D Take 2-4 mg by mouth 4 (four) times daily as needed for diarrhea or loose stools.   metoprolol  succinate  25 MG 24 hr tablet Commonly known as: TOPROL -XL Take 1 tablet (25 mg total) by mouth 2 (two) times daily. What changed:  how to take this when to take this   Omega-3 1000 MG Caps Take 1,000 mg by mouth daily.        Follow-up Information     Stohl, Jeoffrey Morrison, NP. Go in 2 week(s).   Specialty: Cardiology Contact information: [address] MEDICINE [facility] [address] [place] [address] [phone]                Allergies  Allergen Reactions   Naproxen Other (See Comments)    headache   Lidocaine  Hives   Amoxicillin -Pot Clavulanate Diarrhea   Celecoxib Other (See Comments)   Gentamicin Other (See Comments)    Peeled away corneas   Gluten Meal Diarrhea and Other (See Comments)    Abdominal cramps with enriched wheat flour   Lactose Diarrhea and Other (See Comments)   Other Other (See Comments)    Flu vaccine-chills/sweats    Ultram  [Tramadol ] Other (See Comments)    insomnia  Meloxicam Palpitations    heart palpitations    Consultations: Cardio    Procedures/Studies: CT Angio Chest PE W/Cm &/Or Wo Cm Result Date: 08/07/2024 EXAM: CTA CHEST 08/07/2024 01:39:55 AM TECHNIQUE: CTA of the chest was performed after the administration of intravenous contrast. Multiplanar reformatted images are provided for review. MIP images are provided for review. Automated exposure control, iterative reconstruction, and/or weight based adjustment of the mA/kV was utilized to reduce the radiation dose to as low as reasonably achievable. COMPARISON: Portable chest 08/06/2024, portable chest 07/15/2023. CLINICAL HISTORY: Pulmonary embolism (PE) suspected, high prob. Left-sided chest tightness, fevers, coughing, chills, and shortness of breath. Increasingly experiencing chest pain and shortness of breath with exertion. FINDINGS: PULMONARY ARTERIES: Pulmonary arteries are adequately opacified for evaluation. No acute pulmonary embolus. Main pulmonary artery is normal in caliber. MEDIASTINUM:  There is cardiomegaly with a biatrial chamber predominance, greater prominence of the right atrium, and IVC reflux, which may be seen with right heart dysfunction or tricuspid regurgitation. There is a slightly high RV/LV ratio of 1.04 but there is no arterial dilatation or embolism. There is no substantial pericardial effusion. No visible coronary calcifications. There is a normal variant retroesophageal aberrant right subclavian artery. There is mild atherosclerosis in the aorta and great vessels without aneurysm, stenosis, or dissection. Pulmonary veins are nondilated. LYMPH NODES: No mediastinal, hilar or axillary lymphadenopathy. LUNGS AND PLEURA: There is bilateral apical reticulated scarring. There is diffuse bronchial thickening, greatest in the lower lobes, with scattered subsegmental bronchial impactions in both lower lobes. There are posterior atelectatic changes in both lungs with asymmetric posterior basal haziness in the left lower lobe, which could be asymmetric atelectasis or developing bronchopneumonia. No evidence of pleural effusion or pneumothorax. There is mild elevation of the right hemidiaphragm. UPPER ABDOMEN: The gallbladder is absent, with no acute upper abdominal findings. SOFT TISSUES AND BONES: Bilateral breast implants with no chest wall mass. There is osteopenia, mild kyphodextroscoliosis and degenerative changes of the thoracic spine. No acute or other significant osseous findings. IMPRESSION: 1. No evidence of pulmonary embolism. 2. Cardiomegaly with biatrial chamber predominance, greater prominence of the right atrium, and IVC reflux, suggestive of right heart dysfunction or tricuspid regurgitation. Slightly high RV/LV ratio of 1.04. 3. Posterior atelectatic changes in both lungs with asymmetric posterior basal haziness in the left lower lobe, which could be asymmetric atelectasis or developing bronchopneumonia. 4. Diffuse bronchial thickening , with scattered subsegmental bronchial  impactions in the lower lobes. 5. Aberrant right subclavian artery. Electronically signed by: Francis Quam MD 08/07/2024 02:06 AM EST RP Workstation: [id number]   DG Chest Port 1 View Result Date: 08/06/2024 CLINICAL DATA:  Chest pain EXAM: PORTABLE CHEST 1 VIEW COMPARISON:  Chest x-ray 07/15/2023 FINDINGS: The heart size and mediastinal contours are within normal limits. Both lungs are clear. The visualized skeletal structures are unremarkable. IMPRESSION: No active disease. Electronically Signed   By: Greig Pique M.D.   On: 08/06/2024 23:04   (Echo, Carotid, EGD, Colonoscopy, ERCP)    Subjective: Pt c/o fatigue    Discharge Exam: Vitals:   08/08/24 0322 08/08/24 0817  BP: 123/82 (!) 129/90  Pulse: 68 77  Resp: 18 16  Temp: 97.7 F (36.5 C) 98.2 F (36.8 C)  SpO2: 95% 92%   Vitals:   08/07/24 2118 08/08/24 0000 08/08/24 0322 08/08/24 0817  BP:  130/76 123/82 (!) 129/90  Pulse: 75 77 68 77  Resp:  19 18 16   Temp:  98.6 F (37 C) 97.7 F (36.5 C)  98.2 F (36.8 C)  TempSrc:    Oral  SpO2:  96% 95% 92%  Weight:      Height:        General: Pt is alert, awake, not in acute distress Cardiovascular: irregularly irregular, no rubs, no gallops Respiratory: decreased breath sounds b/l  Abdominal: Soft, NT, ND, bowel sounds + Extremities: no edema, no cyanosis    The results of significant diagnostics from this hospitalization (including imaging, microbiology, ancillary and laboratory) are listed below for reference.     Microbiology: Recent Results (from the past 240 hours)  Culture, blood (Routine x 2)     Status: None (Preliminary result)   Collection Time: 08/06/24 10:19 PM   Specimen: BLOOD  Result Value Ref Range Status   Specimen Description BLOOD BLOOD RIGHT ARM  Final   Special Requests   Final    BOTTLES DRAWN AEROBIC AND ANAEROBIC Blood Culture adequate volume   Culture   Final    NO GROWTH 2 DAYS Performed at [facility], [address], [place], [place] [address]    Report Status PENDING  Incomplete  Resp panel by RT-PCR (RSV, Flu A&B, Covid) Anterior Nasal Swab     Status: Abnormal   Collection Time: 08/06/24 10:27 PM   Specimen: Anterior Nasal Swab  Result Value Ref Range Status   SARS Coronavirus 2 by RT PCR POSITIVE (A) NEGATIVE Final    Comment: (NOTE) SARS-CoV-2 target nucleic acids are DETECTED.  The SARS-CoV-2 RNA is generally detectable in upper respiratory specimens during the acute phase of infection. Positive results are indicative of the presence of the identified virus, but do not rule out bacterial infection or co-infection with other pathogens not detected by the test. Clinical correlation with patient history and other diagnostic information is necessary to determine patient infection status. The expected result is Negative.  Fact Sheet for Patients: bloggercourse.com  Fact Sheet for Healthcare Providers: seriousbroker.it  This test is not yet approved or cleared by the [place]  FDA and  has been authorized for detection and/or diagnosis of SARS-CoV-2 by FDA under an Emergency Use Authorization (EUA).  This EUA will remain in effect (meaning this test can be used) for the duration of  the COVID-19 declaration under Section 564(b)(1) of the A ct, 21 U.S.C. section 360bbb-3(b)(1), unless the authorization is terminated or revoked sooner.     Influenza A by PCR NEGATIVE NEGATIVE Final   Influenza B by PCR NEGATIVE NEGATIVE Final    Comment: (NOTE) The Xpert Xpress SARS-CoV-2/FLU/RSV plus assay is intended as an aid in the diagnosis of influenza from Nasopharyngeal swab specimens and should not be used as a sole basis for treatment. Nasal washings and aspirates are unacceptable for Xpert Xpress SARS-CoV-2/FLU/RSV testing.  Fact Sheet for Patients: bloggercourse.com  Fact Sheet for Healthcare  Providers: seriousbroker.it  This test is not yet approved or cleared by the [place]  FDA and has been authorized for detection and/or diagnosis of SARS-CoV-2 by FDA under an Emergency Use Authorization (EUA). This EUA will remain in effect (meaning this test can be used) for the duration of the COVID-19 declaration under Section 564(b)(1) of the Act, 21 U.S.C. section 360bbb-3(b)(1), unless the authorization is terminated or revoked.     Resp Syncytial Virus by PCR NEGATIVE NEGATIVE Final    Comment: (NOTE) Fact Sheet for Patients: bloggercourse.com  Fact Sheet for Healthcare Providers: seriousbroker.it  This test is not yet approved or cleared by the [place]  FDA and has been  authorized for detection and/or diagnosis of SARS-CoV-2 by FDA under an Emergency Use Authorization (EUA). This EUA will remain in effect (meaning this test can be used) for the duration of the COVID-19 declaration under Section 564(b)(1) of the Act, 21 U.S.C. section 360bbb-3(b)(1), unless the authorization is terminated or revoked.  Performed at [facility], [address] [place], [place] [address]   Group A Strep by PCR [facility] Only)     Status: None   Collection Time: 08/06/24 10:43 PM   Specimen: Throat; Sterile Swab  Result Value Ref Range Status   Group A Strep by PCR NOT DETECTED NOT DETECTED Final    Comment: Performed at [facility], [address] [place], [place] [address]  Culture, blood (Routine x 2)     Status: None (Preliminary result)   Collection Time: 08/06/24 10:50 PM   Specimen: BLOOD RIGHT HAND  Result Value Ref Range Status   Specimen Description BLOOD RIGHT HAND  Final   Special Requests   Final    BOTTLES DRAWN AEROBIC AND ANAEROBIC Blood Culture results may not be optimal due to an inadequate volume of blood received in culture bottles   Culture   Final    NO GROWTH 2  DAYS Performed at [facility], [address], [place], [place] [address]    Report Status PENDING  Incomplete  Expectorated Sputum Assessment w Gram Stain, Rflx to Resp Cult     Status: None   Collection Time: 08/08/24  9:16 AM   Specimen: Sputum  Result Value Ref Range Status   Specimen Description SPUTUM  Final   Special Requests NONE  Final   Sputum evaluation   Final    THIS SPECIMEN IS ACCEPTABLE FOR SPUTUM CULTURE Performed at [facility], [address] [place], [place] [address]    Report Status 08/08/2024 FINAL  Final     Labs: BNP (last 3 results) No results for input(s): BNP in the last 8760 hours. Basic Metabolic Panel: Recent Labs  Lab 08/06/24 2219 08/07/24 0348 08/08/24 0417  NA 140 138 139  K 4.1 4.0 3.5  CL 101 104 105  CO2 27 23 26   GLUCOSE 94 97 87  BUN 13 9 10   CREATININE 0.70 0.58 0.64  CALCIUM 9.4 8.6* 8.1*   Liver Function Tests: Recent Labs  Lab 08/06/24 2219  AST 27  ALT 23  ALKPHOS 70  BILITOT 0.4  PROT 7.1  ALBUMIN 4.4   No results for input(s): LIPASE, AMYLASE in the last 168 hours. No results for input(s): AMMONIA in the last 168 hours. CBC: Recent Labs  Lab 08/06/24 2219 08/07/24 0348 08/08/24 0417  WBC 3.9* 4.2 3.6*  NEUTROABS 2.6  --   --   HGB 14.0 12.7 12.8  HCT 40.8 37.4 37.8  MCV 92.7 94.2 95.2  PLT 151 136* 140*   Cardiac Enzymes: No results for input(s): CKTOTAL, CKMB, CKMBINDEX, TROPONINI in the last 168 hours. BNP: Invalid input(s): POCBNP CBG: No results for input(s): GLUCAP in the last 168 hours. D-Dimer No results for input(s): DDIMER in the last 72 hours. Hgb A1c No results for input(s): HGBA1C in the last 72 hours. Lipid Profile No results for input(s): CHOL, HDL, LDLCALC, TRIG, CHOLHDL, LDLDIRECT in the last 72 hours. Thyroid  function studies No results for input(s): TSH, T4TOTAL, T3FREE, THYROIDAB in the last 72 hours.  Invalid  input(s): FREET3 Anemia work up No results for input(s): VITAMINB12, FOLATE, FERRITIN, TIBC, IRON, RETICCTPCT in the last  72 hours. Urinalysis    Component Value Date/Time   COLORURINE STRAW (A) 08/06/2024 2219   APPEARANCEUR CLEAR (A) 08/06/2024 2219   LABSPEC 1.008 08/06/2024 2219   PHURINE 7.0 08/06/2024 2219   GLUCOSEU NEGATIVE 08/06/2024 2219   HGBUR SMALL (A) 08/06/2024 2219   HGBUR small 11/13/2010 1437   BILIRUBINUR NEGATIVE 08/06/2024 2219   BILIRUBINUR neg 09/05/2014 1622   KETONESUR NEGATIVE 08/06/2024 2219   PROTEINUR NEGATIVE 08/06/2024 2219   UROBILINOGEN negative 09/05/2014 1622   UROBILINOGEN 0.2 11/13/2010 1437   NITRITE NEGATIVE 08/06/2024 2219   LEUKOCYTESUR NEGATIVE 08/06/2024 2219   Sepsis Labs Recent Labs  Lab 08/06/24 2219 08/07/24 0348 08/08/24 0417  WBC 3.9* 4.2 3.6*   Microbiology Recent Results (from the past 240 hours)  Culture, blood (Routine x 2)     Status: None (Preliminary result)   Collection Time: 08/06/24 10:19 PM   Specimen: BLOOD  Result Value Ref Range Status   Specimen Description BLOOD BLOOD RIGHT ARM  Final   Special Requests   Final    BOTTLES DRAWN AEROBIC AND ANAEROBIC Blood Culture adequate volume   Culture   Final    NO GROWTH 2 DAYS Performed at [facility], [address] [place], [place] [address]    Report Status PENDING  Incomplete  Resp panel by RT-PCR (RSV, Flu A&B, Covid) Anterior Nasal Swab     Status: Abnormal   Collection Time: 08/06/24 10:27 PM   Specimen: Anterior Nasal Swab  Result Value Ref Range Status   SARS Coronavirus 2 by RT PCR POSITIVE (A) NEGATIVE Final    Comment: (NOTE) SARS-CoV-2 target nucleic acids are DETECTED.  The SARS-CoV-2 RNA is generally detectable in upper respiratory specimens during the acute phase of infection. Positive results are indicative of the presence of the identified virus, but do not rule out bacterial infection or co-infection with other  pathogens not detected by the test. Clinical correlation with patient history and other diagnostic information is necessary to determine patient infection status. The expected result is Negative.  Fact Sheet for Patients: bloggercourse.com  Fact Sheet for Healthcare Providers: seriousbroker.it  This test is not yet approved or cleared by the [place]  FDA and  has been authorized for detection and/or diagnosis of SARS-CoV-2 by FDA under an Emergency Use Authorization (EUA).  This EUA will remain in effect (meaning this test can be used) for the duration of  the COVID-19 declaration under Section 564(b)(1) of the A ct, 21 U.S.C. section 360bbb-3(b)(1), unless the authorization is terminated or revoked sooner.     Influenza A by PCR NEGATIVE NEGATIVE Final   Influenza B by PCR NEGATIVE NEGATIVE Final    Comment: (NOTE) The Xpert Xpress SARS-CoV-2/FLU/RSV plus assay is intended as an aid in the diagnosis of influenza from Nasopharyngeal swab specimens and should not be used as a sole basis for treatment. Nasal washings and aspirates are unacceptable for Xpert Xpress SARS-CoV-2/FLU/RSV testing.  Fact Sheet for Patients: bloggercourse.com  Fact Sheet for Healthcare Providers: seriousbroker.it  This test is not yet approved or cleared by the [place]  FDA and has been authorized for detection and/or diagnosis of SARS-CoV-2 by FDA under an Emergency Use Authorization (EUA). This EUA will remain in effect (meaning this test can be used) for the duration of the COVID-19 declaration under Section 564(b)(1) of the Act, 21 U.S.C. section 360bbb-3(b)(1), unless the authorization is terminated or revoked.     Resp Syncytial Virus by PCR NEGATIVE NEGATIVE Final  Comment: (NOTE) Fact Sheet for Patients: bloggercourse.com  Fact Sheet for Healthcare  Providers: seriousbroker.it  This test is not yet approved or cleared by the [place]  FDA and has been authorized for detection and/or diagnosis of SARS-CoV-2 by FDA under an Emergency Use Authorization (EUA). This EUA will remain in effect (meaning this test can be used) for the duration of the COVID-19 declaration under Section 564(b)(1) of the Act, 21 U.S.C. section 360bbb-3(b)(1), unless the authorization is terminated or revoked.  Performed at [facility], [address] [place], [place] [address]   Group A Strep by PCR [facility] Only)     Status: None   Collection Time: 08/06/24 10:43 PM   Specimen: Throat; Sterile Swab  Result Value Ref Range Status   Group A Strep by PCR NOT DETECTED NOT DETECTED Final    Comment: Performed at [facility], [address] [place], [place] [address]  Culture, blood (Routine x 2)     Status: None (Preliminary result)   Collection Time: 08/06/24 10:50 PM   Specimen: BLOOD RIGHT HAND  Result Value Ref Range Status   Specimen Description BLOOD RIGHT HAND  Final   Special Requests   Final    BOTTLES DRAWN AEROBIC AND ANAEROBIC Blood Culture results may not be optimal due to an inadequate volume of blood received in culture bottles   Culture   Final    NO GROWTH 2 DAYS Performed at [facility], [address], [place], [place] [address]    Report Status PENDING  Incomplete  Expectorated Sputum Assessment w Gram Stain, Rflx to Resp Cult     Status: None   Collection Time: 08/08/24  9:16 AM   Specimen: Sputum  Result Value Ref Range Status   Specimen Description SPUTUM  Final   Special Requests NONE  Final   Sputum evaluation   Final    THIS SPECIMEN IS ACCEPTABLE FOR SPUTUM CULTURE Performed at [facility], [address] [place], [place] [address]    Report Status 08/08/2024 FINAL  Final     Time coordinating discharge: 36 minutes  SIGNED:   Anthony CHRISTELLA Pouch,  MD  [facility] 08/08/2024, 12:47 PM Pager   If 7PM-7AM, please contact night-coverage [email or web address]

## 2024-08-08 NOTE — Plan of Care (Signed)
   Problem: Education: Goal: Knowledge of risk factors and measures for prevention of condition will improve Outcome: Progressing   Problem: Coping: Goal: Psychosocial and spiritual needs will be supported Outcome: Progressing   Problem: Respiratory: Goal: Will maintain a patent airway Outcome: Progressing Goal: Complications related to the disease process, condition or treatment will be avoided or minimized Outcome: Progressing   Problem: Education: Goal: Knowledge of General Education information will improve Description: Including pain rating scale, medication(s)/side effects and non-pharmacologic comfort measures Outcome: Progressing   Problem: Health Behavior/Discharge Planning: Goal: Ability to manage health-related needs will improve Outcome: Progressing   Problem: Clinical Measurements: Goal: Ability to maintain clinical measurements within normal limits will improve Outcome: Progressing Goal: Will remain free from infection Outcome: Progressing Goal: Diagnostic test results will improve Outcome: Progressing Goal: Respiratory complications will improve Outcome: Progressing Goal: Cardiovascular complication will be avoided Outcome: Progressing   Problem: Activity: Goal: Risk for activity intolerance will decrease Outcome: Progressing   Problem: Nutrition: Goal: Adequate nutrition will be maintained Outcome: Progressing   Problem: Coping: Goal: Level of anxiety will decrease Outcome: Progressing   Problem: Elimination: Goal: Will not experience complications related to bowel motility Outcome: Progressing Goal: Will not experience complications related to urinary retention Outcome: Progressing   Problem: Pain Managment: Goal: General experience of comfort will improve and/or be controlled Outcome: Progressing   Problem: Safety: Goal: Ability to remain free from injury will improve Outcome: Progressing   Problem: Skin Integrity: Goal: Risk for impaired  skin integrity will decrease Outcome: Progressing   Problem: Activity: Goal: Ability to tolerate increased activity will improve Outcome: Progressing   Problem: Clinical Measurements: Goal: Ability to maintain a body temperature in the normal range will improve Outcome: Progressing   Problem: Respiratory: Goal: Ability to maintain adequate ventilation will improve Outcome: Progressing Goal: Ability to maintain a clear airway will improve Outcome: Progressing

## 2024-08-08 NOTE — Plan of Care (Signed)
  Problem: Education: Goal: Knowledge of risk factors and measures for prevention of condition will improve Outcome: Progressing   Problem: Coping: Goal: Psychosocial and spiritual needs will be supported Outcome: Progressing   Problem: Respiratory: Goal: Will maintain a patent airway Outcome: Progressing   Problem: Education: Goal: Knowledge of risk factors and measures for prevention of condition will improve 08/08/2024 1203 by Gwenn Prentice BIRCH, RN Outcome: Progressing 08/08/2024 1201 by Gwenn Prentice BIRCH, RN Outcome: Progressing   Problem: Coping: Goal: Psychosocial and spiritual needs will be supported 08/08/2024 1203 by Gwenn Prentice BIRCH, RN Outcome: Progressing 08/08/2024 1201 by Gwenn Prentice BIRCH, RN Outcome: Progressing   Problem: Respiratory: Goal: Will maintain a patent airway 08/08/2024 1203 by Gwenn Prentice BIRCH, RN Outcome: Progressing 08/08/2024 1201 by Gwenn Prentice BIRCH, RN Outcome: Progressing Goal: Complications related to the disease process, condition or treatment will be avoided or minimized Outcome: Progressing   Problem: Education: Goal: Knowledge of General Education information will improve Description: Including pain rating scale, medication(s)/side effects and non-pharmacologic comfort measures Outcome: Progressing   Problem: Health Behavior/Discharge Planning: Goal: Ability to manage health-related needs will improve Outcome: Progressing   Problem: Clinical Measurements: Goal: Ability to maintain clinical measurements within normal limits will improve Outcome: Progressing Goal: Will remain free from infection Outcome: Progressing Goal: Diagnostic test results will improve Outcome: Progressing Goal: Respiratory complications will improve Outcome: Progressing Goal: Cardiovascular complication will be avoided Outcome: Progressing   Problem: Activity: Goal: Risk for activity intolerance will decrease Outcome: Progressing   Problem:  Nutrition: Goal: Adequate nutrition will be maintained Outcome: Progressing   Problem: Coping: Goal: Level of anxiety will decrease Outcome: Progressing   Problem: Elimination: Goal: Will not experience complications related to bowel motility Outcome: Progressing Goal: Will not experience complications related to urinary retention Outcome: Progressing   Problem: Pain Managment: Goal: General experience of comfort will improve and/or be controlled Outcome: Progressing   Problem: Safety: Goal: Ability to remain free from injury will improve Outcome: Progressing   Problem: Skin Integrity: Goal: Risk for impaired skin integrity will decrease Outcome: Progressing   Problem: Activity: Goal: Ability to tolerate increased activity will improve Outcome: Progressing   Problem: Clinical Measurements: Goal: Ability to maintain a body temperature in the normal range will improve Outcome: Progressing   Problem: Respiratory: Goal: Ability to maintain adequate ventilation will improve Outcome: Progressing Goal: Ability to maintain a clear airway will improve Outcome: Progressing

## 2024-08-08 NOTE — Progress Notes (Signed)
 PHARMACIST - PHYSICIAN COMMUNICATION DR:   Trudy CONCERNING: Antibiotic IV to Oral Route Change Policy  RECOMMENDATION: This patient is receiving Azithromycin by the intravenous route.  Based on criteria approved by the Pharmacy and Therapeutics Committee, the antibiotic(s) is/are being converted to the equivalent oral dose form(s).   DESCRIPTION: These criteria include: Patient being treated for a respiratory tract infection, urinary tract infection, cellulitis or clostridium difficile associated diarrhea if on metronidazole The patient is not neutropenic and does not exhibit a GI malabsorption state The patient is eating (either orally or via tube) and/or has been taking other orally administered medications for a least 24 hours The patient is improving clinically and has a Tmax < 100.5  [name] PharmD, BCPS 08/08/2024 11:16 AM

## 2024-08-08 NOTE — TOC CM/SW Note (Signed)
 Transition of Care [facility]) - Inpatient Brief Assessment   Patient Details  Name: [name] MRN: [id number] Date of Birth: Dec 25, 1950  Transition of Care [facility]) CM/SW Contact:    Shasta DELENA Daring, RN Phone Number: 08/08/2024, 11:40 AM   Clinical Narrative: Chart reviewed. No TOC needs identified at this time.   Transition of Care Asessment: Insurance and Status: Insurance coverage has been reviewed Patient has primary care physician: Yes       Social Drivers of Health Review: SDOH reviewed no interventions necessary Readmission risk has been reviewed: No Transition of care needs: no transition of care needs at this time

## 2024-08-10 LAB — CULTURE, RESPIRATORY W GRAM STAIN: Culture: NORMAL

## 2024-08-11 LAB — CULTURE, BLOOD (ROUTINE X 2)
Culture: NO GROWTH
Culture: NO GROWTH
Special Requests: ADEQUATE

## 2024-12-28 ENCOUNTER — Encounter (INDEPENDENT_AMBULATORY_CARE_PROVIDER_SITE_OTHER): Admitting: Ophthalmology
